# Patient Record
Sex: Male | Born: 1965 | Race: White | Hispanic: No | Marital: Married | Smoking: Current every day smoker
Health system: Southern US, Community
[De-identification: ages and names within clinical notes are randomized; demographics above are authoritative.]

## PROBLEM LIST (undated history)

## (undated) DIAGNOSIS — I10 Essential (primary) hypertension: Secondary | ICD-10-CM

## (undated) HISTORY — PX: OTHER SURGICAL HISTORY: SHX169

---

## 1999-07-01 ENCOUNTER — Emergency Department (HOSPITAL_COMMUNITY): Admission: EM | Admit: 1999-07-01 | Discharge: 1999-07-01 | Payer: Self-pay | Admitting: Emergency Medicine

## 2018-02-10 ENCOUNTER — Emergency Department (HOSPITAL_COMMUNITY): Payer: BLUE CROSS/BLUE SHIELD

## 2018-02-10 ENCOUNTER — Encounter (HOSPITAL_COMMUNITY): Payer: Self-pay | Admitting: Emergency Medicine

## 2018-02-10 ENCOUNTER — Inpatient Hospital Stay (HOSPITAL_COMMUNITY)
Admission: EM | Admit: 2018-02-10 | Discharge: 2018-02-13 | DRG: 446 | Disposition: A | Discharge status: Home / Self Care | Payer: BLUE CROSS/BLUE SHIELD | Attending: Internal Medicine | Admitting: Internal Medicine

## 2018-02-10 DIAGNOSIS — Z7982 Long term (current) use of aspirin: Secondary | ICD-10-CM

## 2018-02-10 DIAGNOSIS — N611 Abscess of the breast and nipple: Secondary | ICD-10-CM | POA: Diagnosis present

## 2018-02-10 DIAGNOSIS — K810 Acute cholecystitis: Secondary | ICD-10-CM | POA: Diagnosis present

## 2018-02-10 DIAGNOSIS — Z8249 Family history of ischemic heart disease and other diseases of the circulatory system: Secondary | ICD-10-CM

## 2018-02-10 DIAGNOSIS — I10 Essential (primary) hypertension: Secondary | ICD-10-CM | POA: Diagnosis present

## 2018-02-10 DIAGNOSIS — F17210 Nicotine dependence, cigarettes, uncomplicated: Secondary | ICD-10-CM | POA: Diagnosis present

## 2018-02-10 DIAGNOSIS — K811 Chronic cholecystitis: Secondary | ICD-10-CM | POA: Diagnosis present

## 2018-02-10 DIAGNOSIS — T3695XA Adverse effect of unspecified systemic antibiotic, initial encounter: Secondary | ICD-10-CM | POA: Diagnosis present

## 2018-02-10 DIAGNOSIS — H5713 Ocular pain, bilateral: Secondary | ICD-10-CM | POA: Diagnosis not present

## 2018-02-10 DIAGNOSIS — R509 Fever, unspecified: Secondary | ICD-10-CM | POA: Diagnosis not present

## 2018-02-10 DIAGNOSIS — R748 Abnormal levels of other serum enzymes: Secondary | ICD-10-CM | POA: Diagnosis present

## 2018-02-10 DIAGNOSIS — R1010 Upper abdominal pain, unspecified: Secondary | ICD-10-CM | POA: Diagnosis not present

## 2018-02-10 DIAGNOSIS — R300 Dysuria: Secondary | ICD-10-CM | POA: Diagnosis not present

## 2018-02-10 DIAGNOSIS — H53149 Visual discomfort, unspecified: Secondary | ICD-10-CM | POA: Diagnosis not present

## 2018-02-10 DIAGNOSIS — K760 Fatty (change of) liver, not elsewhere classified: Secondary | ICD-10-CM | POA: Diagnosis present

## 2018-02-10 DIAGNOSIS — Y92009 Unspecified place in unspecified non-institutional (private) residence as the place of occurrence of the external cause: Secondary | ICD-10-CM | POA: Diagnosis not present

## 2018-02-10 DIAGNOSIS — T368X5A Adverse effect of other systemic antibiotics, initial encounter: Secondary | ICD-10-CM | POA: Diagnosis not present

## 2018-02-10 DIAGNOSIS — R5381 Other malaise: Secondary | ICD-10-CM | POA: Diagnosis not present

## 2018-02-10 DIAGNOSIS — R7989 Other specified abnormal findings of blood chemistry: Secondary | ICD-10-CM | POA: Diagnosis present

## 2018-02-10 DIAGNOSIS — Z888 Allergy status to other drugs, medicaments and biological substances status: Secondary | ICD-10-CM

## 2018-02-10 DIAGNOSIS — N6002 Solitary cyst of left breast: Secondary | ICD-10-CM | POA: Diagnosis present

## 2018-02-10 DIAGNOSIS — R5383 Other fatigue: Secondary | ICD-10-CM | POA: Diagnosis not present

## 2018-02-10 DIAGNOSIS — L729 Follicular cyst of the skin and subcutaneous tissue, unspecified: Secondary | ICD-10-CM

## 2018-02-10 DIAGNOSIS — R51 Headache: Secondary | ICD-10-CM | POA: Diagnosis not present

## 2018-02-10 DIAGNOSIS — K7589 Other specified inflammatory liver diseases: Secondary | ICD-10-CM | POA: Diagnosis not present

## 2018-02-10 DIAGNOSIS — R531 Weakness: Secondary | ICD-10-CM | POA: Diagnosis not present

## 2018-02-10 DIAGNOSIS — K719 Toxic liver disease, unspecified: Secondary | ICD-10-CM

## 2018-02-10 DIAGNOSIS — K710 Toxic liver disease with cholestasis: Secondary | ICD-10-CM | POA: Diagnosis not present

## 2018-02-10 DIAGNOSIS — L089 Local infection of the skin and subcutaneous tissue, unspecified: Secondary | ICD-10-CM

## 2018-02-10 HISTORY — DX: Essential (primary) hypertension: I10

## 2018-02-10 LAB — URINALYSIS, ROUTINE W REFLEX MICROSCOPIC
Bacteria, UA: NONE SEEN
Glucose, UA: NEGATIVE mg/dL
HGB URINE DIPSTICK: NEGATIVE
Ketones, ur: NEGATIVE mg/dL
Leukocytes, UA: NEGATIVE
Nitrite: NEGATIVE
PH: 6 (ref 5.0–8.0)
Protein, ur: 30 mg/dL — AB
SPECIFIC GRAVITY, URINE: 1.021 (ref 1.005–1.030)

## 2018-02-10 LAB — CBC
HCT: 40.8 % (ref 39.0–52.0)
Hemoglobin: 14.7 g/dL (ref 13.0–17.0)
MCH: 33.3 pg (ref 26.0–34.0)
MCHC: 36 g/dL (ref 30.0–36.0)
MCV: 92.3 fL (ref 78.0–100.0)
PLATELETS: 276 10*3/uL (ref 150–400)
RBC: 4.42 MIL/uL (ref 4.22–5.81)
RDW: 12.1 % (ref 11.5–15.5)
WBC: 11.4 10*3/uL — AB (ref 4.0–10.5)

## 2018-02-10 LAB — COMPREHENSIVE METABOLIC PANEL
ALT: 231 U/L — AB (ref 17–63)
AST: 100 U/L — AB (ref 15–41)
Albumin: 3.5 g/dL (ref 3.5–5.0)
Alkaline Phosphatase: 258 U/L — ABNORMAL HIGH (ref 38–126)
Anion gap: 9 (ref 5–15)
BUN: 26 mg/dL — AB (ref 6–20)
CHLORIDE: 95 mmol/L — AB (ref 101–111)
CO2: 26 mmol/L (ref 22–32)
CREATININE: 1.28 mg/dL — AB (ref 0.61–1.24)
Calcium: 8.6 mg/dL — ABNORMAL LOW (ref 8.9–10.3)
GFR calc Af Amer: >60 mL/min (ref >60)
Glucose, Bld: 115 mg/dL — ABNORMAL HIGH (ref 65–99)
POTASSIUM: 4 mmol/L (ref 3.5–5.1)
SODIUM: 130 mmol/L — AB (ref 135–145)
Total Bilirubin: 6.1 mg/dL — ABNORMAL HIGH (ref 0.3–1.2)
Total Protein: 7.2 g/dL (ref 6.5–8.1)

## 2018-02-10 LAB — LIPASE, BLOOD: LIPASE: 26 U/L (ref 11–51)

## 2018-02-10 LAB — I-STAT CG4 LACTIC ACID, ED
Lactic Acid, Venous: 1.11 mmol/L (ref 0.5–1.9)
Lactic Acid, Venous: 0.97 mmol/L (ref 0.5–1.9)

## 2018-02-10 MED ORDER — SODIUM CHLORIDE 0.9 % IV BOLUS (SEPSIS)
1000.0000 mL | Freq: Once | INTRAVENOUS | Status: AC
  Administered 2018-02-10: 1000 mL via INTRAVENOUS

## 2018-02-10 MED ORDER — ACETAMINOPHEN 325 MG PO TABS: 650.0000 mg | ORAL_TABLET | Freq: Four times a day (QID) | ORAL | Status: DC | PRN

## 2018-02-10 MED ORDER — IOPAMIDOL (ISOVUE-300) INJECTION 61%
INTRAVENOUS | Status: AC
  Administered 2018-02-10: 100 mL
  Filled 2018-02-10: qty 100

## 2018-02-10 MED ORDER — SODIUM CHLORIDE 0.9 % IV SOLN
INTRAVENOUS | Status: DC
  Administered 2018-02-10: 100 mL/h via INTRAVENOUS
  Administered 2018-02-11 – 2018-02-12 (×2): via INTRAVENOUS
  Administered 2018-02-12: 75 mL/h via INTRAVENOUS

## 2018-02-10 MED ORDER — MORPHINE SULFATE (PF) 4 MG/ML IV SOLN
3.5000 mg | Freq: Once | INTRAVENOUS | Status: AC
  Administered 2018-02-10: 3.5 mg via INTRAVENOUS
  Filled 2018-02-10: qty 1

## 2018-02-10 MED ORDER — ENOXAPARIN SODIUM 40 MG/0.4ML ~~LOC~~ SOLN
40.0000 mg | SUBCUTANEOUS | Status: DC
  Administered 2018-02-10 – 2018-02-12 (×3): 40 mg via SUBCUTANEOUS
  Filled 2018-02-10 (×3): qty 0.4

## 2018-02-10 MED ORDER — ACETAMINOPHEN 650 MG RE SUPP: 650.0000 mg | Freq: Four times a day (QID) | RECTAL | Status: DC | PRN

## 2018-02-10 MED ORDER — NICOTINE 21 MG/24HR TD PT24
21.0000 mg | MEDICATED_PATCH | Freq: Every day | TRANSDERMAL | Status: DC
  Administered 2018-02-11 – 2018-02-12 (×2): 21 mg via TRANSDERMAL
  Filled 2018-02-10 (×2): qty 1

## 2018-02-10 MED ORDER — ONDANSETRON HCL 4 MG PO TABS: 4.0000 mg | ORAL_TABLET | Freq: Four times a day (QID) | ORAL | Status: DC | PRN

## 2018-02-10 MED ORDER — PIPERACILLIN-TAZOBACTAM 3.375 G IVPB 30 MIN
3.3750 g | Freq: Once | INTRAVENOUS | Status: AC
  Administered 2018-02-10: 3.375 g via INTRAVENOUS
  Filled 2018-02-10: qty 50

## 2018-02-10 MED ORDER — TECHNETIUM TC 99M MEBROFENIN IV KIT
7.8000 | PACK | Freq: Once | INTRAVENOUS | Status: AC | PRN
  Administered 2018-02-10: 7.8 via INTRAVENOUS

## 2018-02-10 MED ORDER — LIDOCAINE-EPINEPHRINE (PF) 2 %-1:200000 IJ SOLN
20.0000 mL | Freq: Once | INTRAMUSCULAR | Status: AC
  Administered 2018-02-10: 20 mL
  Filled 2018-02-10: qty 20

## 2018-02-10 MED ORDER — ONDANSETRON HCL 4 MG/2ML IJ SOLN: 4.0000 mg | Freq: Four times a day (QID) | INTRAMUSCULAR | Status: DC | PRN

## 2018-02-10 MED ORDER — SODIUM CHLORIDE 0.9 % IJ SOLN
INTRAMUSCULAR | Status: AC
  Filled 2018-02-10: qty 50

## 2018-02-10 MED ORDER — PIPERACILLIN-TAZOBACTAM 3.375 G IVPB
3.3750 g | Freq: Three times a day (TID) | INTRAVENOUS | Status: DC
  Administered 2018-02-11 (×2): 3.375 g via INTRAVENOUS
  Filled 2018-02-10 (×2): qty 50

## 2018-02-10 NOTE — ED Notes (Signed)
Patient remains in hida scan.

## 2018-02-10 NOTE — Consult Note (Signed)
[facility] Surgery Consult/Admission Note  Elim Economou Jul 04, 1966  [id number].    Requesting MD: Dr. Venora Maples Chief Complaint/Reason for Consult: RUQ abdominal pain  HPI:   Pt is a 52 year old male with a history of HTN who presented to the ED under advice of his PCP with elevated LFT's and tbili. Pt states he has been fatigued for the last 2 weeks with fevers, chills, anorexia, mild dull RUQ abdominal pain, body aches, and yellowing of eyes. Abdominal pain is nonradiating,nothing makes it better or worse. PCP thought symptoms were related to a left breast abscess that was improving but pt's symptoms were not improving. Over the course of time he was treated with an IM dose of PCN, bactrim, and a Z-pak without any significant improvement. EDP I&D'd left breast abscess. Pt is a smoker and drinks 6 beers a day. He denies CP, SOB, blood in stools, vomiting, nausea. Wife at bedside. We were asked to see about possible cholecystitis.   Workup: Labs: Alk phos 258, AST 100, ALT 231, Tbili 6.1, WBC 11.4,Creatinine 1.28 [place]: Gallbladder wall thickening and mild pericholecystic edema that is likely reactive. Hepatic steatosis. CT abd/pel: There is subtle irregularity of the liver contour without other CT evidence of cirrhosis. Gallbladder is decompressed without cholelithiasis. No evidence of biliary dilatation  ROS:  Review of Systems  Constitutional: Positive for chills, fever and malaise/fatigue. Negative for diaphoresis.  HENT: Negative for sore throat.   Eyes:       + for yellowing of eyes  Respiratory: Negative for cough and shortness of breath.   Cardiovascular: Negative for chest pain.  Gastrointestinal: Positive for abdominal pain. Negative for blood in stool, constipation, diarrhea, melena, nausea and vomiting.  Genitourinary: Positive for dysuria (with fevers only).  Musculoskeletal: Positive for myalgias.  Skin: Negative for rash.       + for yellowing of skin    Neurological: Negative for dizziness, focal weakness, loss of consciousness and headaches.  All other systems reviewed and are negative.    No family history on file.  Past Medical History:  Diagnosis Date  . Hypertension     History reviewed. No pertinent surgical history.  Social History:  reports that he has been smoking cigarettes.  he has never used smokeless tobacco. He reports that he drinks alcohol. His drug history is not on file.  Allergies:  Allergies  Allergen Reactions  . Statins Anxiety     (Not in a hospital admission)  Blood pressure 96/64, pulse 94, temperature 99.5 F (37.5 C), temperature source Oral, resp. rate 20, height 5' 9"  (1.753 m), weight 190 lb (86.2 kg), SpO2 99 %.  Physical Exam  Constitutional: He is oriented to person, place, and time and well-developed, well-nourished, and in no distress. No distress.  HENT:  Head: Normocephalic and atraumatic.  Nose: Nose normal.  Mouth/Throat: Oropharynx is clear and moist. No oropharyngeal exudate.  Eyes: Conjunctivae are normal. Pupils are equal, round, and reactive to light. Right eye exhibits no discharge. Left eye exhibits no discharge. Scleral icterus is present.  Neck: Normal range of motion. Neck supple. No thyromegaly present.  Cardiovascular: Normal rate, regular rhythm, normal heart sounds and intact distal pulses.  No murmur heard. Pulses:      Dorsalis pedis pulses are 2+ on the right side, and 2+ on the left side.  Pulmonary/Chest: Effort normal and breath sounds normal. No respiratory distress. He has no wheezes. He has no rhonchi. He has no rales.  Abdominal: Soft. Normal  appearance and bowel sounds are normal. He exhibits no distension. There is hepatomegaly. There is no splenomegaly. There is tenderness in the right upper quadrant. There is no rigidity and no guarding.  Musculoskeletal: Normal range of motion. He exhibits no edema, tenderness or deformity.  Lymphadenopathy:    He has  no cervical adenopathy.  Neurological: He is alert and oriented to person, place, and time.  Skin: Skin is warm and dry. No rash noted. He is not diaphoretic.  Mild jaundice  Psychiatric: Mood and affect normal.  Nursing note and vitals reviewed.   Results for orders placed or performed during the hospital encounter of 02/10/18 (from the past 48 hour(s))  CBC     Status: Abnormal   Collection Time: 02/10/18 12:04 PM  Result Value Ref Range   WBC 11.4 (H) 4.0 - 10.5 K/uL   RBC 4.42 4.22 - 5.81 MIL/uL   Hemoglobin 14.7 13.0 - 17.0 g/dL   HCT 40.8 39.0 - 52.0 %   MCV 92.3 78.0 - 100.0 fL   MCH 33.3 26.0 - 34.0 pg   MCHC 36.0 30.0 - 36.0 g/dL   RDW 12.1 11.5 - 15.5 %   Platelets 276 150 - 400 K/uL    Comment: Performed at [facility], [place] [address], [place], [place] [address]  Comprehensive metabolic panel     Status: Abnormal   Collection Time: 02/10/18 12:04 PM  Result Value Ref Range   Sodium 130 (L) 135 - 145 mmol/L   Potassium 4.0 3.5 - 5.1 mmol/L   Chloride 95 (L) 101 - 111 mmol/L   CO2 26 22 - 32 mmol/L   Glucose, Bld 115 (H) 65 - 99 mg/dL   BUN 26 (H) 6 - 20 mg/dL   Creatinine, Ser 1.28 (H) 0.61 - 1.24 mg/dL   Calcium 8.6 (L) 8.9 - 10.3 mg/dL   Total Protein 7.2 6.5 - 8.1 g/dL   Albumin 3.5 3.5 - 5.0 g/dL   AST 100 (H) 15 - 41 U/L   ALT 231 (H) 17 - 63 U/L   Alkaline Phosphatase 258 (H) 38 - 126 U/L   Total Bilirubin 6.1 (H) 0.3 - 1.2 mg/dL   GFR calc non Af Amer >60 >60 mL/min   GFR calc Af Amer >60 >60 mL/min    Comment: (NOTE) The eGFR has been calculated using the CKD EPI equation. This calculation has not been validated in all clinical situations. eGFR's persistently <60 mL/min signify possible Chronic Kidney Disease.    Anion gap 9 5 - 15    Comment: Performed at [facility], [place] [address], [place], [place] [address]  Lipase, blood     Status: None   Collection Time: 02/10/18 12:04 PM  Result Value Ref Range    Lipase 26 11 - 51 U/L    Comment: Performed at [facility], [place] [address], [place], [place] [address]  I-Stat CG4 Lactic Acid, ED     Status: None   Collection Time: 02/10/18 12:12 PM  Result Value Ref Range   Lactic Acid, Venous 1.11 0.5 - 1.9 mmol/L  Urinalysis, Routine w reflex microscopic     Status: Abnormal   Collection Time: 02/10/18  1:08 PM  Result Value Ref Range   Color, Urine AMBER (A) YELLOW    Comment: BIOCHEMICALS MAY BE AFFECTED BY COLOR   APPearance CLEAR CLEAR   Specific Gravity, Urine 1.021 1.005 - 1.030   pH 6.0 5.0 - 8.0  Glucose, UA NEGATIVE NEGATIVE mg/dL   Hgb urine dipstick NEGATIVE NEGATIVE   Bilirubin Urine MODERATE (A) NEGATIVE   Ketones, ur NEGATIVE NEGATIVE mg/dL   Protein, ur 30 (A) NEGATIVE mg/dL   Nitrite NEGATIVE NEGATIVE   Leukocytes, UA NEGATIVE NEGATIVE   RBC / HPF 0-5 0 - 5 RBC/hpf   WBC, UA 6-30 0 - 5 WBC/hpf   Bacteria, UA NONE SEEN NONE SEEN   Squamous Epithelial / LPF 0-5 (A) NONE SEEN   Mucus PRESENT    Hyaline Casts, UA PRESENT     Comment: Performed at [facility], [place] [address] [place], [place] [address]   Dg Chest 2 View  Result Date: 02/10/2018 CLINICAL DATA:  Fever and malaise EXAM: CHEST - 2 VIEW COMPARISON:  None. FINDINGS: The heart size and mediastinal contours are within normal limits. Both lungs are clear. Mild scarring in the lingula. The visualized skeletal structures are unremarkable. IMPRESSION: No active cardiopulmonary disease. Electronically Signed   By: Franchot Gallo M.D.   On: 02/10/2018 12:48   Ct Abdomen Pelvis W Contrast  Result Date: 02/10/2018 CLINICAL DATA:  52 year old male with a history of elevated LFTs and scleral icterus EXAM: CT ABDOMEN AND PELVIS WITH CONTRAST TECHNIQUE: Multidetector CT imaging of the abdomen and pelvis was performed using the standard protocol following bolus administration of intravenous contrast. CONTRAST:  136m ISOVUE-300 IOPAMIDOL  (ISOVUE-300) INJECTION 61% COMPARISON:  Ultrasound 02/10/2018 FINDINGS: Lower chest: Respiratory motion somewhat limits evaluation of the lungs. Minimal atelectasis/scarring at the dependent aspect of the lung bases. Hepatobiliary: Cranial caudal span of the right liver measures 19.3 cm. Subtle irregularity on the surface of the liver. No focal liver lesion. No periportal edema. Gallbladder relatively decompressed, otherwise unremarkable. Pancreas: Unremarkable pancreas Spleen: Unremarkable spleen Adrenals/Urinary Tract: Unremarkable adrenal glands. Unremarkable right kidney with no hydronephrosis or nephrolithiasis. Unremarkable course of the right ureter. Unremarkable left kidney with no hydronephrosis or nephrolithiasis. Unremarkable course of the left ureter. Unremarkable urinary bladder Stomach/Bowel: Unremarkable stomach. Unremarkable small bowel without distention. No focal wall thickening. No inflammatory changes of the mesentery. Normal appendix. No abnormally distended colon. No inflammatory changes. No focal colonic wall thickening Vascular/Lymphatic: Mild atherosclerotic changes of the abdominal aorta. No dissection or aneurysm. No periaortic fluid. The iliac and bilateral proximal femoral arteries are patent. Small lymph nodes in the portacaval nodal station, not significantly enlarged. Small lymph nodes in the gastrohepatic ligament, not enlarged. No significant mesenteric adenopathy. No retroperitoneal adenopathy. No inguinal adenopathy. Reproductive: Internal prostate calcifications with transverse diameter of the prostate measuring 4.2 cm Other: No abdominal wall hernia Musculoskeletal: No acute displaced fracture. Mild degenerative changes of the spine. Mild degenerative changes of the hips. IMPRESSION: No acute CT finding. There is subtle irregularity of the liver contour without other CT evidence of cirrhosis. Gallbladder is decompressed without cholelithiasis. No evidence of biliary dilatation.  Aortic Atherosclerosis (ICD10-I70.0). Electronically Signed   By: JCorrie MckusickD.O.   On: 02/10/2018 14:53   [place]domen Limited Ruq  Result Date: 02/10/2018 CLINICAL DATA:  Upper abdominal pain.  Elevated liver enzymes. EXAM: ULTRASOUND ABDOMEN LIMITED RIGHT UPPER QUADRANT COMPARISON:  None. FINDINGS: Gallbladder: Gallbladder wall thickening to 4 mm with small crescent of edema around the fundus. Given negative Murphy sign, no stones, and limited distension this is likely reactive thickening rather than from cholecystitis. Common bile duct: Diameter: 5 mm Liver: Echogenic diffusely with gallbladder fossa sparing. No focal lesion is seen. Portal vein is patent on color Doppler imaging with normal  direction of blood flow towards the liver. IMPRESSION: 1. Gallbladder wall thickening and mild pericholecystic edema that is likely reactive, as above. 2. Hepatic steatosis. Electronically Signed   By: Monte Fantasia M.D.   On: 02/10/2018 12:40      Assessment/Plan  Hx of HTN  RUQ abdominal pain elevated LFT's and Tbili Fevers Jaundice  - no cholelithiasis, no evidence of biliary dilatation, gallbladder wall thickening up to 72m likely reactive rather than from cholecystitis - hepatic steatosis on [place] concerns for cirrhosis on CT in setting of ETOH abuse - HIDA scan pending to rule out cholecystitis - Do not believe this is cholecystitis and more concerning for cholangitis although no biliary dilatation was seen on imaging.   If HIDA is negative would recommend MRCP for further evaluation of bilary ducts.   We will follow. Thank you for the consult.   JKalman Drape PMt [facility] 02/10/2018, 4:01 PM Pager: [phone]: [phone]-Fri 7:00 am-4:30 pm Sat-Sun 7:00 am-11:30 am

## 2018-02-10 NOTE — Consult Note (Signed)
Reason for Consult: Abnormal liver enzymes, fevers, chills, and RUQ pain Referring Physician: ER  Manuel Bates HPI: This is a 52 year old male with a PMH of HTN who was complaining about fevers, chills, and RUQ pain for a couple of weeks. Initially his symptoms started with a left subcutaneous breast cyst.  He was told that if it enlarged further evaluation/treatment would be pursued.  He reported that the cyst was enlarging and he had fever up to 102.  It was around that time that he also complained about RUQ pain.  This past Friday, one week ago, his wife reported that his symptoms were quite severe and was at home with fevers and shaking chills.  Over the course of time he was treated with an IM dose of PCN, bactrim, and a Z-pak without any significant improvement.  There was evidence of bilirubin in his urine and then a liver panel was performed yesterday revealing an obstructive pattern.  As a result he was advised to present to the ER.  The ER ultrasound was negative for stones or biliary ductal dilation, however, his gallbladder was thickened at 4 mm.  During the ultrasound examination he did not exhibit a Murphy's sign, but he does complain about RUQ pain.  Per his report, he has a high tolerance to pain.  Past Medical History:  Diagnosis Date  . Hypertension     History reviewed. No pertinent surgical history.  No family history on file.  Social History:  reports that he has been smoking cigarettes.  he has never used smokeless tobacco. He reports that he drinks alcohol. His drug history is not on file.  Allergies:  Allergies  Allergen Reactions  . Statins Anxiety    Medications:  Scheduled: . sodium chloride       Continuous:   Results for orders placed or performed during the hospital encounter of 02/10/18 (from the past 24 hour(s))  CBC     Status: Abnormal   Collection Time: 02/10/18 12:04 PM  Result Value Ref Range   WBC 11.4 (H) 4.0 - 10.5 K/uL   RBC 4.42  4.22 - 5.81 MIL/uL   Hemoglobin 14.7 13.0 - 17.0 g/dL   HCT 16.1 09.6 - 04.5 %   MCV 92.3 78.0 - 100.0 fL   MCH 33.3 26.0 - 34.0 pg   MCHC 36.0 30.0 - 36.0 g/dL   RDW 40.9 81.1 - 91.4 %   Platelets 276 150 - 400 K/uL  Comprehensive metabolic panel     Status: Abnormal   Collection Time: 02/10/18 12:04 PM  Result Value Ref Range   Sodium 130 (L) 135 - 145 mmol/L   Potassium 4.0 3.5 - 5.1 mmol/L   Chloride 95 (L) 101 - 111 mmol/L   CO2 26 22 - 32 mmol/L   Glucose, Bld 115 (H) 65 - 99 mg/dL   BUN 26 (H) 6 - 20 mg/dL   Creatinine, Ser 7.82 (H) 0.61 - 1.24 mg/dL   Calcium 8.6 (L) 8.9 - 10.3 mg/dL   Total Protein 7.2 6.5 - 8.1 g/dL   Albumin 3.5 3.5 - 5.0 g/dL   AST 956 (H) 15 - 41 U/L   ALT 231 (H) 17 - 63 U/L   Alkaline Phosphatase 258 (H) 38 - 126 U/L   Total Bilirubin 6.1 (H) 0.3 - 1.2 mg/dL   GFR calc non Af Amer >60 >60 mL/min   GFR calc Af Amer >60 >60 mL/min   Anion gap 9 5 - 15  Lipase, blood     Status: None   Collection Time: 02/10/18 12:04 PM  Result Value Ref Range   Lipase 26 11 - 51 U/L  I-Stat CG4 Lactic Acid, ED     Status: None   Collection Time: 02/10/18 12:12 PM  Result Value Ref Range   Lactic Acid, Venous 1.11 0.5 - 1.9 mmol/L  Urinalysis, Routine w reflex microscopic     Status: Abnormal   Collection Time: 02/10/18  1:08 PM  Result Value Ref Range   Color, Urine AMBER (A) YELLOW   APPearance CLEAR CLEAR   Specific Gravity, Urine 1.021 1.005 - 1.030   pH 6.0 5.0 - 8.0   Glucose, UA NEGATIVE NEGATIVE mg/dL   Hgb urine dipstick NEGATIVE NEGATIVE   Bilirubin Urine MODERATE (A) NEGATIVE   Ketones, ur NEGATIVE NEGATIVE mg/dL   Protein, ur 30 (A) NEGATIVE mg/dL   Nitrite NEGATIVE NEGATIVE   Leukocytes, UA NEGATIVE NEGATIVE   RBC / HPF 0-5 0 - 5 RBC/hpf   WBC, UA 6-30 0 - 5 WBC/hpf   Bacteria, UA NONE SEEN NONE SEEN   Squamous Epithelial / LPF 0-5 (A) NONE SEEN   Mucus PRESENT    Hyaline Casts, UA PRESENT      Dg Chest 2 View  Result Date:  02/10/2018 CLINICAL DATA:  Fever and malaise EXAM: CHEST - 2 VIEW COMPARISON:  None. FINDINGS: The heart size and mediastinal contours are within normal limits. Both lungs are clear. Mild scarring in the lingula. The visualized skeletal structures are unremarkable. IMPRESSION: No active cardiopulmonary disease. Electronically Signed   By: Marlan Palauharles  Clark M.D.   On: 02/10/2018 12:48   Ct Abdomen Pelvis W Contrast  Result Date: 02/10/2018 CLINICAL DATA:  52 year old male with a history of elevated LFTs and scleral icterus EXAM: CT ABDOMEN AND PELVIS WITH CONTRAST TECHNIQUE: Multidetector CT imaging of the abdomen and pelvis was performed using the standard protocol following bolus administration of intravenous contrast. CONTRAST:  100mL ISOVUE-300 IOPAMIDOL (ISOVUE-300) INJECTION 61% COMPARISON:  Ultrasound 02/10/2018 FINDINGS: Lower chest: Respiratory motion somewhat limits evaluation of the lungs. Minimal atelectasis/scarring at the dependent aspect of the lung bases. Hepatobiliary: Cranial caudal span of the right liver measures 19.3 cm. Subtle irregularity on the surface of the liver. No focal liver lesion. No periportal edema. Gallbladder relatively decompressed, otherwise unremarkable. Pancreas: Unremarkable pancreas Spleen: Unremarkable spleen Adrenals/Urinary Tract: Unremarkable adrenal glands. Unremarkable right kidney with no hydronephrosis or nephrolithiasis. Unremarkable course of the right ureter. Unremarkable left kidney with no hydronephrosis or nephrolithiasis. Unremarkable course of the left ureter. Unremarkable urinary bladder Stomach/Bowel: Unremarkable stomach. Unremarkable small bowel without distention. No focal wall thickening. No inflammatory changes of the mesentery. Normal appendix. No abnormally distended colon. No inflammatory changes. No focal colonic wall thickening Vascular/Lymphatic: Mild atherosclerotic changes of the abdominal aorta. No dissection or aneurysm. No periaortic fluid.  The iliac and bilateral proximal femoral arteries are patent. Small lymph nodes in the portacaval nodal station, not significantly enlarged. Small lymph nodes in the gastrohepatic ligament, not enlarged. No significant mesenteric adenopathy. No retroperitoneal adenopathy. No inguinal adenopathy. Reproductive: Internal prostate calcifications with transverse diameter of the prostate measuring 4.2 cm Other: No abdominal wall hernia Musculoskeletal: No acute displaced fracture. Mild degenerative changes of the spine. Mild degenerative changes of the hips. IMPRESSION: No acute CT finding. There is subtle irregularity of the liver contour without other CT evidence of cirrhosis. Gallbladder is decompressed without cholelithiasis. No evidence of biliary dilatation. Aortic Atherosclerosis (ICD10-I70.0). Electronically Signed  By: Gilmer Mor D.O.   On: 02/10/2018 14:53   US Abdomen Limited Ruq  Result Date: 02/10/2018 CLINICAL DATA:  Upper abdominal pain.  Elevated liver enzymes. EXAM: ULTRASOUND ABDOMEN LIMITED RIGHT UPPER QUADRANT COMPARISON:  None. FINDINGS: Gallbladder: Gallbladder wall thickening to 4 mm with small crescent of edema around the fundus. Given negative Murphy sign, no stones, and limited distension this is likely reactive thickening rather than from cholecystitis. Common bile duct: Diameter: 5 mm Liver: Echogenic diffusely with gallbladder fossa sparing. No focal lesion is seen. Portal vein is patent on color Doppler imaging with normal direction of blood flow towards the liver. IMPRESSION: 1. Gallbladder wall thickening and mild pericholecystic edema that is likely reactive, as above. 2. Hepatic steatosis. Electronically Signed   By: Marnee Spring M.D.   On: 02/10/2018 12:40    ROS:  As stated above in the HPI otherwise negative.  Blood pressure 93/65, pulse 91, temperature 99.5 F (37.5 C), temperature source Oral, resp. rate 20, height 5\' 9"  (1.753 m), weight 86.2 kg (190 lb), SpO2 95 %.     PE: Gen: NAD, Alert and Oriented HEENT:  [place]/AT, EOMI Neck: Supple, no LAD Lungs: CTA Bilaterally CV: RRR without M/G/R ABM: Soft, some RUQ tenderness, +BS Ext: No C/C/E  Assessment/Plan: 1) Abnormal liver enzymes. 2) Jaundice. 3) RUQ pain.   Even though the ultrasound does not show acute cholecystitis, I am concerned that he has a gallbladder etiology for his symptoms.  A CT scan was performed and pending.  The physical examination was significant for some pain with palpation in the RUQ, but it was not severe.  There is no evidence of overt stones or biliary ductal dilation.  Surgical input would be beneficial in his case.  Plan: 1) Surgical consultation. 2) Supportive care for now.  Manuel Bates D 02/10/2018, 3:38 PM

## 2018-02-10 NOTE — ED Triage Notes (Signed)
[facility] to [facility] ct [facility]. [place] discussed results with Dr [name] with GI.

## 2018-02-10 NOTE — ED Notes (Signed)
ED TO INPATIENT HANDOFF REPORT  Name/Age/Gender Manuel Bates 52 y.o. male  Code Status Code Status History    This patient does not have a recorded code status. Please follow your organizational policy for patients in this situation.    Advance Directive Documentation     Most Recent Value  Type of Advance Directive  Healthcare Power of Attorney, Living will  Pre-existing out of facility DNR order (yellow form or pink MOST form)  No data  "MOST" Form in Place?  No data      Home/SNF/Other Home  Chief Complaint ct scan   Level of Care/Admitting Diagnosis ED Disposition    ED Disposition Condition Comment   Admit  Hospital Area: [facility] [[id number]]  Level of Care: Med-Surg [16]  Diagnosis: Acute cholecystitis [575.0.ICD-9-CM]  Admitting Physician: Hosie Poisson [[id number]]  Attending Physician: Hosie Poisson [phone]  Estimated length of stay: past midnight tomorrow  Certification:: I certify this patient will need inpatient services for at least 2 midnights  PT Class (Do Not Modify): Inpatient [101]  PT Acc Code (Do Not Modify): Private [1]       Medical History Past Medical History:  Diagnosis Date  . Hypertension     Allergies Allergies  Allergen Reactions  . Statins Anxiety    IV Location/Drains/Wounds Patient Lines/Drains/Airways Status   Active Line/Drains/Airways    Name:   Placement date:   Placement time:   Site:   Days:   Peripheral IV 02/10/18 Left Antecubital   02/10/18    1201    Antecubital   less than 1          Labs/Imaging Results for orders placed or performed during the hospital encounter of 02/10/18 (from the past 48 hour(s))  CBC     Status: Abnormal   Collection Time: 02/10/18 12:04 PM  Result Value Ref Range   WBC 11.4 (H) 4.0 - 10.5 K/uL   RBC 4.42 4.22 - 5.81 MIL/uL   Hemoglobin 14.7 13.0 - 17.0 g/dL   HCT 40.8 39.0 - 52.0 %   MCV 92.3 78.0 - 100.0 fL   MCH 33.3 26.0 - 34.0 pg   MCHC 36.0 30.0 - 36.0  g/dL   RDW 12.1 11.5 - 15.5 %   Platelets 276 150 - 400 K/uL    Comment: Performed at [facility], [place] [address], [place], [place] [address]  Comprehensive metabolic panel     Status: Abnormal   Collection Time: 02/10/18 12:04 PM  Result Value Ref Range   Sodium 130 (L) 135 - 145 mmol/L   Potassium 4.0 3.5 - 5.1 mmol/L   Chloride 95 (L) 101 - 111 mmol/L   CO2 26 22 - 32 mmol/L   Glucose, Bld 115 (H) 65 - 99 mg/dL   BUN 26 (H) 6 - 20 mg/dL   Creatinine, Ser 1.28 (H) 0.61 - 1.24 mg/dL   Calcium 8.6 (L) 8.9 - 10.3 mg/dL   Total Protein 7.2 6.5 - 8.1 g/dL   Albumin 3.5 3.5 - 5.0 g/dL   AST 100 (H) 15 - 41 U/L   ALT 231 (H) 17 - 63 U/L   Alkaline Phosphatase 258 (H) 38 - 126 U/L   Total Bilirubin 6.1 (H) 0.3 - 1.2 mg/dL   GFR calc non Af Amer >60 >60 mL/min   GFR calc Af Amer >60 >60 mL/min    Comment: (NOTE) The eGFR has been calculated using the CKD EPI equation. This calculation has not been  validated in all clinical situations. eGFR's persistently <60 mL/min signify possible Chronic Kidney Disease.    Anion gap 9 5 - 15    Comment: Performed at [facility], [place] [address] [place], [place] [address]  Lipase, blood     Status: None   Collection Time: 02/10/18 12:04 PM  Result Value Ref Range   Lipase 26 11 - 51 U/L    Comment: Performed at [facility], [place] [address] [place], [place] [address]  I-Stat CG4 Lactic Acid, ED     Status: None   Collection Time: 02/10/18 12:12 PM  Result Value Ref Range   Lactic Acid, Venous 1.11 0.5 - 1.9 mmol/L  Urinalysis, Routine w reflex microscopic     Status: Abnormal   Collection Time: 02/10/18  1:08 PM  Result Value Ref Range   Color, Urine AMBER (A) YELLOW    Comment: BIOCHEMICALS MAY BE AFFECTED BY COLOR   APPearance CLEAR CLEAR   Specific Gravity, Urine 1.021 1.005 - 1.030   pH 6.0 5.0 - 8.0   Glucose, UA NEGATIVE NEGATIVE mg/dL   Hgb urine dipstick NEGATIVE NEGATIVE    Bilirubin Urine MODERATE (A) NEGATIVE   Ketones, ur NEGATIVE NEGATIVE mg/dL   Protein, ur 30 (A) NEGATIVE mg/dL   Nitrite NEGATIVE NEGATIVE   Leukocytes, UA NEGATIVE NEGATIVE   RBC / HPF 0-5 0 - 5 RBC/hpf   WBC, UA 6-30 0 - 5 WBC/hpf   Bacteria, UA NONE SEEN NONE SEEN   Squamous Epithelial / LPF 0-5 (A) NONE SEEN   Mucus PRESENT    Hyaline Casts, UA PRESENT     Comment: Performed at [facility], [place] [address], [place], [place] [address]  I-Stat CG4 Lactic Acid, ED     Status: None   Collection Time: 02/10/18  5:01 PM  Result Value Ref Range   Lactic Acid, Venous 0.97 0.5 - 1.9 mmol/L   Dg Chest 2 View  Result Date: 02/10/2018 CLINICAL DATA:  Fever and malaise EXAM: CHEST - 2 VIEW COMPARISON:  None. FINDINGS: The heart size and mediastinal contours are within normal limits. Both lungs are clear. Mild scarring in the lingula. The visualized skeletal structures are unremarkable. IMPRESSION: No active cardiopulmonary disease. Electronically Signed   By: Franchot Gallo M.D.   On: 02/10/2018 12:48   Ct Abdomen Pelvis W Contrast  Result Date: 02/10/2018 CLINICAL DATA:  52 year old male with a history of elevated LFTs and scleral icterus EXAM: CT ABDOMEN AND PELVIS WITH CONTRAST TECHNIQUE: Multidetector CT imaging of the abdomen and pelvis was performed using the standard protocol following bolus administration of intravenous contrast. CONTRAST:  181m ISOVUE-300 IOPAMIDOL (ISOVUE-300) INJECTION 61% COMPARISON:  Ultrasound 02/10/2018 FINDINGS: Lower chest: Respiratory motion somewhat limits evaluation of the lungs. Minimal atelectasis/scarring at the dependent aspect of the lung bases. Hepatobiliary: Cranial caudal span of the right liver measures 19.3 cm. Subtle irregularity on the surface of the liver. No focal liver lesion. No periportal edema. Gallbladder relatively decompressed, otherwise unremarkable. Pancreas: Unremarkable pancreas Spleen: Unremarkable spleen  Adrenals/Urinary Tract: Unremarkable adrenal glands. Unremarkable right kidney with no hydronephrosis or nephrolithiasis. Unremarkable course of the right ureter. Unremarkable left kidney with no hydronephrosis or nephrolithiasis. Unremarkable course of the left ureter. Unremarkable urinary bladder Stomach/Bowel: Unremarkable stomach. Unremarkable small bowel without distention. No focal wall thickening. No inflammatory changes of the mesentery. Normal appendix. No abnormally distended colon. No inflammatory changes. No focal colonic wall thickening Vascular/Lymphatic: Mild atherosclerotic changes of the abdominal  aorta. No dissection or aneurysm. No periaortic fluid. The iliac and bilateral proximal femoral arteries are patent. Small lymph nodes in the portacaval nodal station, not significantly enlarged. Small lymph nodes in the gastrohepatic ligament, not enlarged. No significant mesenteric adenopathy. No retroperitoneal adenopathy. No inguinal adenopathy. Reproductive: Internal prostate calcifications with transverse diameter of the prostate measuring 4.2 cm Other: No abdominal wall hernia Musculoskeletal: No acute displaced fracture. Mild degenerative changes of the spine. Mild degenerative changes of the hips. IMPRESSION: No acute CT finding. There is subtle irregularity of the liver contour without other CT evidence of cirrhosis. Gallbladder is decompressed without cholelithiasis. No evidence of biliary dilatation. Aortic Atherosclerosis (ICD10-I70.0). Electronically Signed   By: Corrie Mckusick D.O.   On: 02/10/2018 14:53   US Abdomen Limited Ruq  Result Date: 02/10/2018 CLINICAL DATA:  Upper abdominal pain.  Elevated liver enzymes. EXAM: ULTRASOUND ABDOMEN LIMITED RIGHT UPPER QUADRANT COMPARISON:  None. FINDINGS: Gallbladder: Gallbladder wall thickening to 4 mm with small crescent of edema around the fundus. Given negative Murphy sign, no stones, and limited distension this is likely reactive thickening  rather than from cholecystitis. Common bile duct: Diameter: 5 mm Liver: Echogenic diffusely with gallbladder fossa sparing. No focal lesion is seen. Portal vein is patent on color Doppler imaging with normal direction of blood flow towards the liver. IMPRESSION: 1. Gallbladder wall thickening and mild pericholecystic edema that is likely reactive, as above. 2. Hepatic steatosis. Electronically Signed   By: Monte Fantasia M.D.   On: 02/10/2018 12:40    Pending Labs Unresulted Labs (From admission, onward)   Start     Ordered   02/10/18 1633  Urine culture  Add-on,   STAT     02/10/18 1632   02/10/18 1632  Blood culture (routine x 2)  BLOOD CULTURE X 2,   STAT     02/10/18 1631   02/10/18 1151  Wound or Superficial Culture  Once,   STAT     02/10/18 1151   Signed and Held  HIV antibody (Routine Testing)  Once,   R     Signed and Held   Signed and Held  Creatinine, serum  (enoxaparin (LOVENOX)    CrCl >/= 30 ml/min)  Weekly,   R    Comments:  while on enoxaparin therapy    Signed and Held   Signed and Held  Comprehensive metabolic panel  Tomorrow morning,   R     Signed and Held      Vitals/Pain Today's Vitals   02/10/18 1400 02/10/18 1505 02/10/18 1530 02/10/18 1642  BP: _0 100/67  Pulse: 90 91 94 96  Resp: _1 Temp:    (!) 102.1 F (38.9 C)  TempSrc:    Oral  SpO2: 100% 95% 99% 96%  Weight:      Height:        Isolation Precautions No active isolations  Medications Medications  sodium chloride 0.9 % injection (not administered)  0.9 %  sodium chloride infusion (not administered)  sodium chloride 0.9 % bolus 1,000 mL (0 mLs Intravenous Stopped 02/10/18 1326)  lidocaine-EPINEPHrine (XYLOCAINE W/EPI) 2 %-1:200000 (PF) injection 20 mL (20 mLs Infiltration Given by Other 02/10/18 1205)  iopamidol (ISOVUE-300) 61 % injection (100 mLs  Contrast Given 02/10/18 1430)  piperacillin-tazobactam (ZOSYN) IVPB 3.375 g (0 g Intravenous Stopped 02/10/18 1640)     Mobility walks

## 2018-02-10 NOTE — H&P (Signed)
History and Physical    Micahel Bates ZOX:[id number] DOB: [birth year] DOA: 02/10/2018  PCP: [name], MD  Patient coming from: Home.   I have personally briefly reviewed patient's old medical records in [facility]  Chief Complaint: persistent abdominal pain, fever and sent from clinic for elevated liver function tests.   HPI: Manuel Bates is a 52 y.o. male with medical history significant of hypertension, left chest wall abscess/ infected cyst on oral antibiotics at home, comes in for 2 to 3 weeks of persistent fevers, chills,  abdominal pain, associated with nausea, vomiting . Pain in the RUQ , non radiating,. No diarrhea, no hematochezia, dysuria, headache, sensory deficits, weakness. Labs at the office showed elevated liver function tests and was referred to [facility] for admission for evaluation of cholangitis and acute cholecystitis. Pt reports he takes alcohol and continues to smoke. Lab work in ED revealed sodium of 130, creatinine of 1.28. Elevated liver function tests , AST is 100, ALT of 231, lactic acid is 1.11, wbc count of 11.4.   Review of Systems: As per HPI otherwise 10 point review of systems negative.    Past Medical History:  Diagnosis Date  . Hypertension     Past Surgical History:  Procedure Laterality Date  . OTHER SURGICAL HISTORY     sinus surgery     reports that he has been smoking cigarettes.  He has a 55.50 pack-year smoking history. he has never used smokeless tobacco. He reports that he drinks alcohol. He reports that he does not use drugs.  Allergies  Allergen Reactions  . Statins Anxiety    Family history of hypertension, CAD, and cancer present.   Prior to Admission medications   Medication Sig Start Date End Date Taking? Authorizing Provider  aspirin 81 MG chewable tablet Chew 81 mg by mouth daily.   Yes [provider]  azithromycin (ZITHROMAX) 250 MG tablet TAKE 2 TABLETS BY MOUTH EVERY DAY FOR 5 DAYS 02/06/18   Yes [provider]  sulfamethoxazole-trimethoprim (BACTRIM DS,SEPTRA DS) 800-160 MG tablet Take 1 tablet by mouth 2 (two) times daily. for 10 days 02/01/18  Yes [provider]    Physical Exam: Vitals:   02/10/18 1400 02/10/18 1505 02/10/18 1530 02/10/18 1642  BP: 97/64 93/65 96/64  100/67  Pulse: 90 91 94 96  Resp: 18 20 20 18   Temp:    (!) 102.1 F (38.9 C)  TempSrc:    Oral  SpO2: 100% 95% 99% 96%  Weight:      Height:        Constitutional: NAD, calm, comfortable Vitals:   02/10/18 1400 02/10/18 1505 02/10/18 1530 02/10/18 1642  BP: 97/64 93/65 96/64  100/67  Pulse: 90 91 94 96  Resp: 18 20 20 18   Temp:    (!) 102.1 F (38.9 C)  TempSrc:    Oral  SpO2: 100% 95% 99% 96%  Weight:      Height:       Eyes: PERRL, lids and conjunctivae normal ENMT: Mucous membranes are moist. Posterior pharynx clear of any exudate or lesions.Normal dentition.  Neck: normal, supple, no masses, no thyromegaly Respiratory: clear to auscultation bilaterally, no wheezing, no crackles. Normal respiratory effort. No accessory muscle use.  Cardiovascular: Regular rate and rhythm, no murmurs / rubs / gallops. No extremity edema. 2+ pedal pulses. No carotid bruits.  Abdomen: RUQ tenderness without any signs of peritonitis.  Musculoskeletal: no clubbing / cyanosis. No joint deformity upper and lower extremities.  Good ROM, no contractures. Normal muscle tone.  Skin: no rashes, lesions, ulcers. No induration Neurologic: CN 2-12 grossly intact. Sensation intact, DTR normal. Strength 5/5 in all 4.  Psychiatric: Normal judgment and insight. Alert and oriented x 3. Normal mood.     Labs on Admission: I have personally reviewed following labs and imaging studies  CBC: Recent Labs  Lab 02/10/18 1204  WBC 11.4*  HGB 14.7  HCT 40.8  MCV 92.3  PLT 276   Basic Metabolic Panel: Recent Labs  Lab 02/10/18 1204  NA 130*  K 4.0  CL 95*  CO2 26  GLUCOSE 115*  BUN 26*  CREATININE  1.28*  CALCIUM 8.6*   GFR: Estimated Creatinine Clearance: 73.4 mL/min (A) (by C-G formula based on SCr of 1.28 mg/dL (H)). Liver Function Tests: Recent Labs  Lab 02/10/18 1204  AST 100*  ALT 231*  ALKPHOS 258*  BILITOT 6.1*  PROT 7.2  ALBUMIN 3.5   Recent Labs  Lab 02/10/18 1204  LIPASE 26   No results for input(s): AMMONIA in the last 168 hours. Coagulation Profile: No results for input(s): INR, PROTIME in the last 168 hours. Cardiac Enzymes: No results for input(s): CKTOTAL, CKMB, CKMBINDEX, TROPONINI in the last 168 hours. BNP (last 3 results) No results for input(s): PROBNP in the last 8760 hours. HbA1C: No results for input(s): HGBA1C in the last 72 hours. CBG: No results for input(s): GLUCAP in the last 168 hours. Lipid Profile: No results for input(s): CHOL, HDL, LDLCALC, TRIG, CHOLHDL, LDLDIRECT in the last 72 hours. Thyroid Function Tests: No results for input(s): TSH, T4TOTAL, FREET4, T3FREE, THYROIDAB in the last 72 hours. Anemia Panel: No results for input(s): VITAMINB12, FOLATE, FERRITIN, TIBC, IRON, RETICCTPCT in the last 72 hours. Urine analysis:    Component Value Date/Time   COLORURINE AMBER (A) 02/10/2018 1308   APPEARANCEUR CLEAR 02/10/2018 1308   LABSPEC 1.021 02/10/2018 1308   PHURINE 6.0 02/10/2018 1308   GLUCOSEU NEGATIVE 02/10/2018 1308   HGBUR NEGATIVE 02/10/2018 1308   BILIRUBINUR MODERATE (A) 02/10/2018 1308   KETONESUR NEGATIVE 02/10/2018 1308   PROTEINUR 30 (A) 02/10/2018 1308   NITRITE NEGATIVE 02/10/2018 1308   LEUKOCYTESUR NEGATIVE 02/10/2018 1308    Radiological Exams on Admission: Dg Chest 2 View  Result Date: 02/10/2018 CLINICAL DATA:  Fever and malaise EXAM: CHEST - 2 VIEW COMPARISON:  None. FINDINGS: The heart size and mediastinal contours are within normal limits. Both lungs are clear. Mild scarring in the lingula. The visualized skeletal structures are unremarkable. IMPRESSION: No active cardiopulmonary disease.  Electronically Signed   By: Marlan Palauharles  Clark M.D.   On: 02/10/2018 12:48   Ct Abdomen Pelvis W Contrast  Result Date: 02/10/2018 CLINICAL DATA:  52 year old male with a history of elevated LFTs and scleral icterus EXAM: CT ABDOMEN AND PELVIS WITH CONTRAST TECHNIQUE: Multidetector CT imaging of the abdomen and pelvis was performed using the standard protocol following bolus administration of intravenous contrast. CONTRAST:  100mL ISOVUE-300 IOPAMIDOL (ISOVUE-300) INJECTION 61% COMPARISON:  Ultrasound 02/10/2018 FINDINGS: Lower chest: Respiratory motion somewhat limits evaluation of the lungs. Minimal atelectasis/scarring at the dependent aspect of the lung bases. Hepatobiliary: Cranial caudal span of the right liver measures 19.3 cm. Subtle irregularity on the surface of the liver. No focal liver lesion. No periportal edema. Gallbladder relatively decompressed, otherwise unremarkable. Pancreas: Unremarkable pancreas Spleen: Unremarkable spleen Adrenals/Urinary Tract: Unremarkable adrenal glands. Unremarkable right kidney with no hydronephrosis or nephrolithiasis. Unremarkable course of the right ureter. Unremarkable left kidney with no  hydronephrosis or nephrolithiasis. Unremarkable course of the left ureter. Unremarkable urinary bladder Stomach/Bowel: Unremarkable stomach. Unremarkable small bowel without distention. No focal wall thickening. No inflammatory changes of the mesentery. Normal appendix. No abnormally distended colon. No inflammatory changes. No focal colonic wall thickening Vascular/Lymphatic: Mild atherosclerotic changes of the abdominal aorta. No dissection or aneurysm. No periaortic fluid. The iliac and bilateral proximal femoral arteries are patent. Small lymph nodes in the portacaval nodal station, not significantly enlarged. Small lymph nodes in the gastrohepatic ligament, not enlarged. No significant mesenteric adenopathy. No retroperitoneal adenopathy. No inguinal adenopathy. Reproductive:  Internal prostate calcifications with transverse diameter of the prostate measuring 4.2 cm Other: No abdominal wall hernia Musculoskeletal: No acute displaced fracture. Mild degenerative changes of the spine. Mild degenerative changes of the hips. IMPRESSION: No acute CT finding. There is subtle irregularity of the liver contour without other CT evidence of cirrhosis. Gallbladder is decompressed without cholelithiasis. No evidence of biliary dilatation. Aortic Atherosclerosis (ICD10-I70.0). Electronically Signed   By: Gilmer Mor D.O.   On: 02/10/2018 14:53   US Abdomen Limited Ruq  Result Date: 02/10/2018 CLINICAL DATA:  Upper abdominal pain.  Elevated liver enzymes. EXAM: ULTRASOUND ABDOMEN LIMITED RIGHT UPPER QUADRANT COMPARISON:  None. FINDINGS: Gallbladder: Gallbladder wall thickening to 4 mm with small crescent of edema around the fundus. Given negative Murphy sign, no stones, and limited distension this is likely reactive thickening rather than from cholecystitis. Common bile duct: Diameter: 5 mm Liver: Echogenic diffusely with gallbladder fossa sparing. No focal lesion is seen. Portal vein is patent on color Doppler imaging with normal direction of blood flow towards the liver. IMPRESSION: 1. Gallbladder wall thickening and mild pericholecystic edema that is likely reactive, as above. 2. Hepatic steatosis. Electronically Signed   By: Marnee Spring M.D.   On: 02/10/2018 12:40    EKG: not done.   Assessment/Plan Active Problems:   Acute cholecystitis  Nausea, vomiting, RUQ Pain, fevers and chills. - ? Acute vs chronic cholecystitis.  - CT abd and pelvis is not significant. CBD not dilated.  - [place] ABD shows non specific peri cholecystic edema.  Surgery and GI consulted for recommendations.  Zosyn for possible cholangitis.  Monitor liver function test.     Hypertension:  bp parameters borderline. Hold home bp meds.    Tobacco abuse:  Started on nicotine patch.   Left chest wall  infected cyst/ abscess:  Drained today and fluid sent for analysis.   DVT prophylaxis: lovenox.  Code Status: full code.  Family Communication: wife at bedside.  Disposition Plan: pending resolution of abd pain.  Consults called: surgery/ GI.  Admission status: inpatient/ med surg.    Kathlen Mody MD [facility] Pager [phone]  If 7PM-7AM, please contact night-coverage [email or web address] Password [email or web address]  02/10/2018, 5:10 PM

## 2018-02-10 NOTE — Progress Notes (Signed)
Pt gave permission for his wife to be in the room while questions were asked on the nursing admission hx. Manuel Bates. Carleane Anthany Thornhill BSN, RN-BC Admissions RN 02/10/2018 4:50 PM

## 2018-02-10 NOTE — ED Provider Notes (Addendum)
[facility]-EMERGENCY DEPT Provider Note   CSN: [id number] Arrival date & time: 02/10/18  [id number]     History   Chief Complaint Chief Complaint  Patient presents with  . sent for CT scan  . elevated liver enzymes    HPI Manuel Bates is a 52 y.o. male.  HPI 52 year old male presents the emergency department from an outside clinic with elevated liver function test.  He states over the past 2 weeks he has had increased malaise and anorexia.  He has been more tired.  Is been sleeping more often.  He has had documented fevers at home up to 102.8.  He was originally seen and evaluated for an area on his left breast which was concerned to be cellulitic and was started on Bactrim.  As his infection in his left breast continued to improve yet he continued to have fever and generalized symptoms he was started on additional antibiotics including azithromycin and given a dose of Rocephin in the clinic.  He does report some right upper quadrant abdominal pain at this time.  No significant shortness of breath.  Denies cough.  No dysuria or urinary frequency.  No back pain or flank pain.  No vomiting.  No blood in his stool.  Patient was found to have elevated liver function test today and sent to the ER for further evaluation.  Dr. [name] with gastroenterology is aware of the patient   Past Medical History:  Diagnosis Date  . Hypertension     There are no active problems to display for this patient.   History reviewed. No pertinent surgical history.     Home Medications    Prior to Admission medications   Medication Sig Start Date End Date Taking? Authorizing Provider  aspirin 81 MG chewable tablet Chew 81 mg by mouth daily.   Yes [provider]  azithromycin (ZITHROMAX) 250 MG tablet TAKE 2 TABLETS BY MOUTH EVERY DAY FOR 5 DAYS 02/06/18  Yes [provider]  sulfamethoxazole-trimethoprim (BACTRIM DS,SEPTRA DS) 800-160 MG tablet Take 1 tablet by  mouth 2 (two) times daily. for 10 days 02/01/18  Yes [provider]    Family History No family history on file.  Social History Social History   Tobacco Use  . Smoking status: Current Every Day Smoker    Types: Cigarettes  . Smokeless tobacco: Never Used  Substance Use Topics  . Alcohol use: Yes  . Drug use: Not on file     Allergies   Statins   Review of Systems Review of Systems  All other systems reviewed and are negative.    Physical Exam Updated Vital Signs BP 96/64   Pulse 94   Temp 99.5 F (37.5 C) (Oral)   Resp 20   Ht 5\' 9"  (1.753 m)   Wt 86.2 kg (190 lb)   SpO2 99%   BMI 28.06 kg/m   Physical Exam  Constitutional: He is oriented to person, place, and time. He appears well-developed and well-nourished.  HENT:  Head: Normocephalic and atraumatic.  Eyes: EOM are normal.  Neck: Normal range of motion.  Cardiovascular: Normal rate, regular rhythm, normal heart sounds and intact distal pulses.  Pulmonary/Chest: Effort normal and breath sounds normal. No respiratory distress.  Abdominal: Soft. He exhibits no distension.  Mild right upper quadrant pain  Musculoskeletal: Normal range of motion.  Neurological: He is alert and oriented to person, place, and time.  Skin: Skin is warm and dry.  Jaundice  Psychiatric:  He has a normal mood and affect. Judgment normal.  Nursing note and vitals reviewed.    ED Treatments / Results  Labs (all labs ordered are listed, but only abnormal results are displayed) Labs Reviewed  CBC - Abnormal; Notable for the following components:      Result Value   WBC 11.4 (*)    All other components within normal limits  COMPREHENSIVE METABOLIC PANEL - Abnormal; Notable for the following components:   Sodium 130 (*)    Chloride 95 (*)    Glucose, Bld 115 (*)    BUN 26 (*)    Creatinine, Ser 1.28 (*)    Calcium 8.6 (*)    AST 100 (*)    ALT 231 (*)    Alkaline Phosphatase 258 (*)    Total Bilirubin 6.1 (*)     All other components within normal limits  URINALYSIS, ROUTINE W REFLEX MICROSCOPIC - Abnormal; Notable for the following components:   Color, Urine AMBER (*)    Bilirubin Urine MODERATE (*)    Protein, ur 30 (*)    Squamous Epithelial / LPF 0-5 (*)    All other components within normal limits  AEROBIC CULTURE (SUPERFICIAL SPECIMEN)  LIPASE, BLOOD  I-STAT CG4 LACTIC ACID, ED    EKG  EKG Interpretation None       Radiology Dg Chest 2 View  Result Date: 02/10/2018 CLINICAL DATA:  Fever and malaise EXAM: CHEST - 2 VIEW COMPARISON:  None. FINDINGS: The heart size and mediastinal contours are within normal limits. Both lungs are clear. Mild scarring in the lingula. The visualized skeletal structures are unremarkable. IMPRESSION: No active cardiopulmonary disease. Electronically Signed   By: Marlan Palau M.D.   On: 02/10/2018 12:48   Ct Abdomen Pelvis W Contrast  Result Date: 02/10/2018 CLINICAL DATA:  53 year old male with a history of elevated LFTs and scleral icterus EXAM: CT ABDOMEN AND PELVIS WITH CONTRAST TECHNIQUE: Multidetector CT imaging of the abdomen and pelvis was performed using the standard protocol following bolus administration of intravenous contrast. CONTRAST:  ISOVUE-300 IOPAMIDOL (ISOVUE-300) INJECTION 61% COMPARISON:  Ultrasound 02/10/2018 FINDINGS: Lower chest: Respiratory motion somewhat limits evaluation of the lungs. Minimal atelectasis/scarring at the dependent aspect of the lung bases. Hepatobiliary: Cranial caudal span of the right liver measures 19.3 cm. Subtle irregularity on the surface of the liver. No focal liver lesion. No periportal edema. Gallbladder relatively decompressed, otherwise unremarkable. Pancreas: Unremarkable pancreas Spleen: Unremarkable spleen Adrenals/Urinary Tract: Unremarkable adrenal glands. Unremarkable right kidney with no hydronephrosis or nephrolithiasis. Unremarkable course of the right ureter. Unremarkable left kidney with no  hydronephrosis or nephrolithiasis. Unremarkable course of the left ureter. Unremarkable urinary bladder Stomach/Bowel: Unremarkable stomach. Unremarkable small bowel without distention. No focal wall thickening. No inflammatory changes of the mesentery. Normal appendix. No abnormally distended colon. No inflammatory changes. No focal colonic wall thickening Vascular/Lymphatic: Mild atherosclerotic changes of the abdominal aorta. No dissection or aneurysm. No periaortic fluid. The iliac and bilateral proximal femoral arteries are patent. Small lymph nodes in the portacaval nodal station, not significantly enlarged. Small lymph nodes in the gastrohepatic ligament, not enlarged. No significant mesenteric adenopathy. No retroperitoneal adenopathy. No inguinal adenopathy. Reproductive: Internal prostate calcifications with transverse diameter of the prostate measuring 4.2 cm Other: No abdominal wall hernia Musculoskeletal: No acute displaced fracture. Mild degenerative changes of the spine. Mild degenerative changes of the hips. IMPRESSION: No acute CT finding. There is subtle irregularity of the liver contour without other CT evidence of cirrhosis. Gallbladder is  decompressed without cholelithiasis. No evidence of biliary dilatation. Aortic Atherosclerosis (ICD10-I70.0). Electronically Signed   By: Gilmer Mor D.O.   On: 02/10/2018 14:53   US Abdomen Limited Ruq  Result Date: 02/10/2018 CLINICAL DATA:  Upper abdominal pain.  Elevated liver enzymes. EXAM: ULTRASOUND ABDOMEN LIMITED RIGHT UPPER QUADRANT COMPARISON:  None. FINDINGS: Gallbladder: Gallbladder wall thickening to 4 mm with small crescent of edema around the fundus. Given negative Murphy sign, no stones, and limited distension this is likely reactive thickening rather than from cholecystitis. Common bile duct: Diameter: 5 mm Liver: Echogenic diffusely with gallbladder fossa sparing. No focal lesion is seen. Portal vein is patent on color Doppler imaging  with normal direction of blood flow towards the liver. IMPRESSION: 1. Gallbladder wall thickening and mild pericholecystic edema that is likely reactive, as above. 2. Hepatic steatosis. Electronically Signed   By: Marnee Spring M.D.   On: 02/10/2018 12:40    Procedures .Marland KitchenIncision and Drainage Performed by: Azalia Bilis, MD Authorized by: Azalia Bilis, MD     INCISION AND DRAINAGE Performed by: Azalia Bilis Consent: Verbal consent obtained. Risks and benefits: risks, benefits and alternatives were discussed Time out performed prior to procedure Type: abscess Body area: left breast Anesthesia: local infiltration Incision was made with a scalpel. Local anesthetic: lidocaine 2% with epinephrine Anesthetic total: 7 ml Complexity: complex Blunt dissection to break up loculations Drainage: purulent Drainage amount: large Packing material: none Patient tolerance: Patient tolerated the procedure well with no immediate complications.     Medications Ordered in ED Medications  sodium chloride 0.9 % injection (not administered)  piperacillin-tazobactam (ZOSYN) IVPB 3.375 g (3.375 g Intravenous New Bag/Given 02/10/18 1604)  sodium chloride 0.9 % bolus 1,000 mL (0 mLs Intravenous Stopped 02/10/18 1326)  lidocaine-EPINEPHrine (XYLOCAINE W/EPI) 2 %-1:200000 (PF) injection 20 mL (20 mLs Infiltration Given by Other 02/10/18 1205)  iopamidol (ISOVUE-300) 61 % injection (100 mLs  Contrast Given 02/10/18 1430)     Initial Impression / Assessment and Plan / ED Course  I have reviewed the triage vital signs and the nursing notes.  Pertinent labs & imaging results that were available during my care of the patient were reviewed by me and considered in my medical decision making (see chart for details).     Patient with mild right upper quadrant pain.  May represent chronic cholecystitis.  IV Zosyn now.  Patient seen and evaluated by Dr. Elnoria Howard who agrees with general surgery evaluation.  Patient  seen and evaluated by general surgery and a HIDA scan will be performed.  Patient will be admitted to hospital via hospitalist service.  Soft blood pressure noted.  We will add a second liter of IV fluids at this time.  We will add blood cultures and lactate.  Final Clinical Impressions(s) / ED Diagnoses   Final diagnoses:  Elevated liver enzymes  Upper abdominal pain    ED Discharge Orders    None       Azalia Bilis, MD 02/10/18 1631    Azalia Bilis, MD 02/13/18 [phone]

## 2018-02-10 NOTE — ED Notes (Signed)
[facility]

## 2018-02-10 NOTE — ED Notes (Signed)
Report given Beth,RN

## 2018-02-11 ENCOUNTER — Other Ambulatory Visit: Payer: Self-pay

## 2018-02-11 DIAGNOSIS — K810 Acute cholecystitis: Secondary | ICD-10-CM

## 2018-02-11 DIAGNOSIS — R5381 Other malaise: Secondary | ICD-10-CM

## 2018-02-11 DIAGNOSIS — L818 Other specified disorders of pigmentation: Secondary | ICD-10-CM

## 2018-02-11 DIAGNOSIS — F17210 Nicotine dependence, cigarettes, uncomplicated: Secondary | ICD-10-CM

## 2018-02-11 DIAGNOSIS — K719 Toxic liver disease, unspecified: Secondary | ICD-10-CM

## 2018-02-11 DIAGNOSIS — H5713 Ocular pain, bilateral: Secondary | ICD-10-CM

## 2018-02-11 DIAGNOSIS — R531 Weakness: Secondary | ICD-10-CM

## 2018-02-11 DIAGNOSIS — R748 Abnormal levels of other serum enzymes: Secondary | ICD-10-CM

## 2018-02-11 DIAGNOSIS — R5383 Other fatigue: Secondary | ICD-10-CM

## 2018-02-11 DIAGNOSIS — R509 Fever, unspecified: Secondary | ICD-10-CM

## 2018-02-11 DIAGNOSIS — R300 Dysuria: Secondary | ICD-10-CM

## 2018-02-11 DIAGNOSIS — K7589 Other specified inflammatory liver diseases: Secondary | ICD-10-CM

## 2018-02-11 DIAGNOSIS — R1010 Upper abdominal pain, unspecified: Secondary | ICD-10-CM

## 2018-02-11 DIAGNOSIS — H53149 Visual discomfort, unspecified: Secondary | ICD-10-CM

## 2018-02-11 DIAGNOSIS — R51 Headache: Secondary | ICD-10-CM

## 2018-02-11 DIAGNOSIS — N6002 Solitary cyst of left breast: Secondary | ICD-10-CM

## 2018-02-11 LAB — COMPREHENSIVE METABOLIC PANEL
ALT: 192 U/L — ABNORMAL HIGH (ref 17–63)
ANION GAP: 10 (ref 5–15)
AST: 91 U/L — ABNORMAL HIGH (ref 15–41)
Albumin: 2.7 g/dL — ABNORMAL LOW (ref 3.5–5.0)
Alkaline Phosphatase: 222 U/L — ABNORMAL HIGH (ref 38–126)
BILIRUBIN TOTAL: 5.9 mg/dL — AB (ref 0.3–1.2)
BUN: 22 mg/dL — ABNORMAL HIGH (ref 6–20)
CHLORIDE: 101 mmol/L (ref 101–111)
CO2: 24 mmol/L (ref 22–32)
Calcium: 8 mg/dL — ABNORMAL LOW (ref 8.9–10.3)
Creatinine, Ser: 1.09 mg/dL (ref 0.61–1.24)
GFR calc Af Amer: >60 mL/min (ref >60)
Glucose, Bld: 102 mg/dL — ABNORMAL HIGH (ref 65–99)
Potassium: 3.9 mmol/L (ref 3.5–5.1)
Sodium: 135 mmol/L (ref 135–145)
TOTAL PROTEIN: 6 g/dL — AB (ref 6.5–8.1)

## 2018-02-11 LAB — PROTIME-INR
INR: 1.18
PROTHROMBIN TIME: 15 s (ref 11.4–15.2)

## 2018-02-11 LAB — HIV ANTIBODY (ROUTINE TESTING W REFLEX): HIV SCREEN 4TH GENERATION: NONREACTIVE

## 2018-02-11 MED ORDER — LEVOFLOXACIN 750 MG PO TABS
750.0000 mg | ORAL_TABLET | Freq: Every day | ORAL | Status: DC
  Administered 2018-02-12 – 2018-02-13 (×2): 750 mg via ORAL
  Filled 2018-02-11 (×2): qty 1

## 2018-02-11 MED ORDER — PIPERACILLIN-TAZOBACTAM 3.375 G IVPB 30 MIN: 3.3750 g | Freq: Four times a day (QID) | INTRAVENOUS | Status: DC

## 2018-02-11 MED ORDER — DOXYCYCLINE HYCLATE 100 MG PO TABS
100.0000 mg | ORAL_TABLET | Freq: Two times a day (BID) | ORAL | Status: DC
  Administered 2018-02-11: 100 mg via ORAL
  Filled 2018-02-11: qty 1

## 2018-02-11 MED ORDER — DOXYCYCLINE HYCLATE 100 MG PO TABS
100.0000 mg | ORAL_TABLET | Freq: Two times a day (BID) | ORAL | Status: DC
  Administered 2018-02-11 – 2018-02-13 (×4): 100 mg via ORAL
  Filled 2018-02-11 (×4): qty 1

## 2018-02-11 MED ORDER — LEVOFLOXACIN 750 MG PO TABS
750.0000 mg | ORAL_TABLET | Freq: Every day | ORAL | Status: DC
  Administered 2018-02-11: 750 mg via ORAL
  Filled 2018-02-11: qty 1

## 2018-02-11 NOTE — Progress Notes (Signed)
HISTORY OF PRESENT ILLNESS:  Manuel Bates is a 52 y.o. male admitted with jaundice and vague upper abdominal discomfort. No further abdominal pain. Liver tests slightly improved. No new complaints.  REVIEW OF SYSTEMS:  All non-GI ROS negative except for fatigue  Past Medical History:  Diagnosis Date  . Hypertension     Past Surgical History:  Procedure Laterality Date  . OTHER SURGICAL HISTORY     sinus surgery    Social History Manuel Bates  reports that he has been smoking cigarettes.  He has a 55.50 pack-year smoking history. he has never used smokeless tobacco. He reports that he drinks alcohol. He reports that he does not use drugs.  family history is not on file.  Allergies  Allergen Reactions  . Statins Anxiety       PHYSICAL EXAMINATION: Vital signs: BP 94/69 (BP Location: Right Arm)   Pulse 89   Temp 99.2 F (37.3 C) (Oral)   Resp 18   Ht 5\' 9"  (1.753 m)   Wt 183 lb 13.8 oz (83.4 kg)   SpO2 96%   BMI 27.15 kg/m   Constitutional: jaundiced but generally well-appearing, no acute distress Psychiatric: alert and oriented x3, cooperative Eyes: extraocular movements intact, anicteric, conjunctiva pink Mouth: oral pharynx moist, no lesions Neck: supple no lymphadenopathy Cardiovascular: heart regular rate and rhythm, no murmur Lungs: clear to auscultation bilaterally Abdomen: soft, nontender, nondistended, no obvious ascites, no peritoneal signs, normal bowel sounds, no organomegaly Rectal:omitted Extremities: no clubbing, cyanosis, or lower extremity edema bilaterally Skin: no lesions on visible extremities Neuro: No focal deficits. No asterixis.    ASSESSMENT:  #1. Elevated liver tests. I suspect drug-induced liver injury. Hepatic synthetic function intact. No abdominal pain. fever may be secondary to the same.   PLAN:  #1. Continue to monitor liver tests and prothrombin time. If the patient were to develop recurrent significant  abdominal pain then surgery could reconsider gallbladder disease. Discussed with patient and Dr. Thedore MinsSingh. Diet as tolerated. Repeat blood work in a.m.Manuel Bates.  Manuel Bates, Jr., M.D. [facility] Division of Gastroenterology

## 2018-02-11 NOTE — Consult Note (Signed)
Date of Admission:  02/10/2018          Reason for Consult: Acute cholestatic liver injury in context of fever, antibiotics    Referring Provider: Dr. Daphine Deutscher   Assessment: 1. Acute cholestatic liver injury, possibly due to one of the antibiotics he was given such as bactrim, azithromycin (vs less likely PCN), vs due to cholecystitis (clinically seems less likely) 2. Infected cyst left breast sp I and D with GP c pairs seen 3. Hepatosteatosis on US liver 4. Tattoo  Plan: 1. DC zosyn in case a penicillin could have been causing DILI 2. Change to levaquin to cover for cholangitis (I am underwhelmed for this diagnosis) and doxycycline to cover for possible MRSA in cyst 3. followup on hepatitis serologies 4. Follow LFTS's closely 5. CCS following in case this turns out to actually be due to bilary tract disease and in case he requires cholecystectomy 6. Consider GI/Hepatology consult or at least discussion of the case with them 7. Make sure viral hepatides have been checked  Active Problems:   Acute cholecystitis   Scheduled Meds: . doxycycline  100 mg Oral Q12H  . enoxaparin (LOVENOX) injection  40 mg Subcutaneous Q24H  . levofloxacin  750 mg Oral Daily  . nicotine  21 mg Transdermal Daily   Continuous Infusions: . sodium chloride 75 mL/hr at 02/11/18 0926   PRN Meds:.acetaminophen **OR** acetaminophen, ondansetron **OR** ondansetron (ZOFRAN) IV  HPI: Manuel Bates is a 52 y.o. 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 At that time this area was not overtly infected and was stable. However within the past two weeks he developed intense erythema in this area with swelling. He had subjective and then objective fevers above 102 with drenching night sweats. He was also having pain behnid his eyes with burning sensation there, headaches and pain when he urinated when he was having fevers. He sought  care with his PCP who prescribed Bactrim which he began taking on March 6, but because his symptoms were not improving and lesion not resolving he was given a shot of IM PCN and azithromycin which was started on March 11th. In the interim he noticed his urine turn dark orange, jaundice and icterus. The admission note states he had abdominal pain and nausea. He had labs done which showed a cholestatic hepatitis and he was referred to the ED. He underwent I and D of the cyst in the ER and cultures are incubating with GP pairs seen on GS. He has been placed on zosyn. His US abdomen showed some GB wall thickening but CT scan was completely unremarkable for biliary pathology as was HIDA scan.   He has an UNDER [facility] abdominal exam with absolutely no tenderness to deep palpation.   I suspect his cholestatic hepatitis is due to a drug induced liver injury either from the bactrim or the azithromycin. Penicillin would seem a less likely cause but is possible and therefore I will take him off the piperacillin.   I will change him to levaquin in case he has cholangitis--which I doubt and doxycyline to cover for possible MRSA in cyst.   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    Review of Systems: Review of Systems  Constitutional: Positive for chills, diaphoresis, fever and malaise/fatigue. Negative for weight loss.  HENT: Negative for congestion, hearing loss, sore throat and tinnitus.   Eyes: Positive for photophobia and pain. Negative for blurred vision and double vision.  Respiratory: Negative for cough, sputum production, shortness of breath and wheezing.   Cardiovascular: Negative for chest pain, palpitations and leg swelling.  Gastrointestinal: Negative for  abdominal pain, blood in stool, constipation, diarrhea, heartburn, melena, nausea and vomiting.  Genitourinary: Positive for dysuria. Negative for flank pain and hematuria.  Musculoskeletal: Negative for back pain, falls, joint pain and myalgias.  Skin: Negative for itching and rash.  Neurological: Positive for weakness and headaches. Negative for dizziness, sensory change, focal weakness and loss of consciousness.  Endo/Heme/Allergies: Does not bruise/bleed easily.  Psychiatric/Behavioral: Negative for depression, memory loss and suicidal ideas. The patient is not nervous/anxious.     Past Medical History:  Diagnosis Date  . Hypertension     Social History   Tobacco Use  . Smoking status: Current Every Day Smoker    Packs/day: 1.50    Years: 37.00    Pack years: 55.50    Types: Cigarettes  . Smokeless tobacco: Never Used  Substance Use Topics  . Alcohol use: Yes    Comment: 6 pack per day on weekends  . Drug use: No    History reviewed. No pertinent family history. Allergies  Allergen Reactions  . Statins Anxiety    OBJECTIVE: Blood pressure 94/69, pulse 89, temperature 99.2 F (37.3 C), temperature source Oral, resp. rate 18, height 5\' 9"  (1.753 m), weight 183 lb 13.8 oz (83.4 kg), SpO2 96 %.  Physical Exam  Constitutional: He is oriented to person, place, and time and well-developed, well-nourished, and in no distress. He appears jaundiced. No distress.  HENT:  Head: Normocephalic and atraumatic.  Right Ear: External ear normal.  Left Ear: External ear normal.  Nose: Nose normal.  Mouth/Throat: Oropharynx is clear and moist. No oropharyngeal exudate.  Eyes: Conjunctivae and EOM are normal. Pupils are equal, round, and reactive to light. Scleral icterus is present.  Neck: Normal range of motion. Neck supple.  Cardiovascular: Normal rate, regular rhythm and normal heart sounds. Exam reveals no gallop and no friction rub.  No murmur heard. Pulmonary/Chest: Effort  normal and breath sounds normal. No respiratory distress. He has no wheezes. He has no rales.  Abdominal: Soft. Bowel sounds are normal. He exhibits no distension and no mass. There is no tenderness. There is no rebound and no guarding.  Musculoskeletal: Normal range of motion. He exhibits no edema or tenderness.  Lymphadenopathy:    He has no cervical adenopathy.  Neurological: He is alert and oriented to person, place, and time. Coordination normal.  Skin: Skin is warm and dry. No rash noted. He is not diaphoretic. No erythema. No pallor.  Psychiatric: Mood, memory, affect and judgment normal.   Tattoo on right arm  Lab Results Lab Results  Component Value Date   WBC 11.4 (H) 02/10/2018   HGB 14.7 02/10/2018   HCT 40.8 02/10/2018   MCV 92.3 02/10/2018   PLT 276 02/10/2018    Lab Results  Component Value Date   CREATININE 1.09 02/11/2018   BUN 22 (H) 02/11/2018   NA 135 02/11/2018   K 3.9 02/11/2018   CL 101 02/11/2018   CO2 24 02/11/2018    Lab Results  Component Value Date  ALT 192 (H) 02/11/2018   AST 91 (H) 02/11/2018   ALKPHOS 222 (H) 02/11/2018   BILITOT 5.9 (H) 02/11/2018     Microbiology: Recent Results (from the past 240 hour(s))  Wound or Superficial Culture     Status: None (Preliminary result)   Collection Time: 02/10/18  4:01 PM  Result Value Ref Range Status   Specimen Description   Final    ABSCESS LEFT BREAST Performed at [facility], [address] [place], [place] [address]    Special Requests   Final    NONE Performed at [facility], [address] [place], [place] [address]    Gram Stain   Final    FEW WBC PRESENT,BOTH PMN AND MONONUCLEAR MODERATE GRAM POSITIVE COCCI IN PAIRS    Culture   Final    NO GROWTH 1 DAY Performed at [facility], [address] [place], [place] [address]    Report Status PENDING  Incomplete  Blood culture (routine x 2)     Status: None (Preliminary result)    Collection Time: 02/10/18  4:47 PM  Result Value Ref Range Status   Specimen Description   Final    BLOOD LEFT HAND Performed at [facility], [address] [place], [place] [address]    Special Requests   Final    BOTTLES DRAWN AEROBIC AND ANAEROBIC Blood Culture adequate volume Performed at [facility], [address] [place], [place] [address]    Culture PENDING  Incomplete   Report Status PENDING  Incomplete    Acey Lavornelius Van Dam, MD [facility] [phone] pager  02/11/2018, 1:06 PM

## 2018-02-11 NOTE — Progress Notes (Signed)
@IPLOG @        PROGRESS NOTE                                                                                                                                                                                                             Patient Demographics:    Manuel Bates, is a 52 y.o. male, DOB - 25-Oct-1966, WJX:[id number]  Admit date - 02/10/2018   Admitting Physician Kathlen Mody, MD  Outpatient Primary MD for the patient is [name], MD  LOS - 1  Chief Complaint  Patient presents with  . sent for CT scan  . elevated liver enzymes       Brief Narrative   Manuel Bates is a 52 y.o. male with medical history significant of hypertension, left chest wall abscess/ infected cyst on oral antibiotics at home, comes in for 2 to 3 weeks of persistent fevers, chills,  abdominal pain, associated with nausea, vomiting . Pain in the RUQ , workup revealed elevated liver enzymes along with bilirubin with HIDA scan being equivocal.   Subjective:    Manuel Bates today has, No headache, No chest pain, No abdominal pain - No Nausea, No new weakness tingling or numbness, No Cough - SOB.     Assessment  & Plan :     1.  Possible chronic cholecystitis.  General surgery and GI on board, CT scan unrevealing but on ultrasound there was edema around the gallbladder although he did not show any gallstones or CBD stones, HIDA scan was equivocal more consistent with chronic cholecystitis.  Currently on bowel rest, empiric antibiotics which currently on Levaquin and doxycycline per ID.  General surgery and GI following.  Patient currently not tender in the right upper quadrant clinically no acute cholecystitis.  HIV & Acute hepatitis panel pending. There is ? if this is drug induced Liver injury due to recent outpatient antibiotic exposure.   2.  History of alcohol binging during weekends.  Counseled to quit.  Does not drink on a daily basis will monitor for any signs of withdrawals.  3.  Recent  chest wall abscess/cyst infection.  Currently on doxycycline per ID.     Diet : Diet NPO time specified Except for: Sips with Meds    Family Communication  :  None  Code Status :  Full  Disposition Plan  :  TBD  Consults  :  GI, CCS  Procedures  :      DVT Prophylaxis  :  Lovenox   Lab Results  Component Value Date   PLT 276 02/10/2018    Inpatient Medications  Scheduled Meds: . enoxaparin (LOVENOX) injection  40 mg Subcutaneous Q24H  . nicotine  21 mg Transdermal Daily   Continuous Infusions: . sodium chloride 75 mL/hr at 02/11/18 0926   PRN Meds:.acetaminophen **OR** [DISCONTINUED] acetaminophen, ondansetron **OR** ondansetron (ZOFRAN) IV  Antibiotics  :    Anti-infectives (From admission, onward)   Start     Dose/Rate Route Frequency Ordered Stop   02/11/18 1400  levofloxacin (LEVAQUIN) tablet 750 mg  Status:  Discontinued     750 mg Oral Daily 02/11/18 1219 02/11/18 1330   02/11/18 1400  doxycycline (VIBRA-TABS) tablet 100 mg  Status:  Discontinued     100 mg Oral Every 12 hours 02/11/18 1219 02/11/18 1330   02/11/18 1345  piperacillin-tazobactam (ZOSYN) IVPB 3.375 g  Status:  Discontinued     3.375 g 100 mL/hr over 30 Minutes Intravenous Every 6 hours 02/11/18 1330 02/11/18 1331   02/11/18 0000  piperacillin-tazobactam (ZOSYN) IVPB 3.375 g  Status:  Discontinued     3.375 g 12.5 mL/hr over 240 Minutes Intravenous Every 8 hours 02/10/18 2352 02/11/18 1217   02/10/18 1600  piperacillin-tazobactam (ZOSYN) IVPB 3.375 g     3.375 g 100 mL/hr over 30 Minutes Intravenous  Once 02/10/18 1555 02/10/18 1640         Objective:   Vitals:   02/10/18 1642 02/10/18 1900 02/10/18 2041 02/11/18 0600  BP: 100/67 99/67  94/69  Pulse: 96 95  89  Resp: 18 18  18   Temp: (!) 102.1 F (38.9 C) 100 F (37.8 C)  99.2 F (37.3 C)  TempSrc: Oral Oral  Oral  SpO2: 96% 95%  96%  Weight:   83.4 kg (183 lb 13.8 oz)   Height:   5\' 9"  (1.753 m)     Wt Readings from Last  3 Encounters:  02/10/18 83.4 kg (183 lb 13.8 oz)     Intake/Output Summary (Last 24 hours) at 02/11/2018 1331 Last data filed at 02/11/2018 1000 Gross per 24 hour  Intake 1519.16 ml  Output 250 ml  Net 1269.16 ml     Physical Exam  Awake Alert, Oriented X 3, No new F.N deficits, Normal affect [place].AT,PERRAL Supple Neck,No JVD, No cervical lymphadenopathy appriciated.  Symmetrical Chest wall movement, Good air movement bilaterally, CTAB RRR,No Gallops,Rubs or new Murmurs, No Parasternal Heave +ve B.Sounds, Abd Soft, No tenderness, No organomegaly appriciated, No rebound - guarding or rigidity. No Cyanosis, Clubbing or edema, No new Rash or bruise      Data Review:    CBC Recent Labs  Lab 02/10/18 1204  WBC 11.4*  HGB 14.7  HCT 40.8  PLT 276  MCV 92.3  MCH 33.3  MCHC 36.0  RDW 12.1    Chemistries  Recent Labs  Lab 02/10/18 1204 02/11/18 0502  NA 130* 135  K 4.0 3.9  CL 95* 101  CO2 26 24  GLUCOSE 115* 102*  BUN 26* 22*  CREATININE 1.28* 1.09  CALCIUM 8.6* 8.0*  AST 100* 91*  ALT 231* 192*  ALKPHOS 258* 222*  BILITOT 6.1* 5.9*   ------------------------------------------------------------------------------------------------------------------ No results for input(s): CHOL, HDL, LDLCALC, TRIG, CHOLHDL, LDLDIRECT in the last 72 hours.  No results found for: HGBA1C ------------------------------------------------------------------------------------------------------------------ No results for input(s): TSH, T4TOTAL, T3FREE, THYROIDAB in the last 72 hours.  Invalid input(s): FREET3 ------------------------------------------------------------------------------------------------------------------ No results for input(s): VITAMINB12, FOLATE, FERRITIN, TIBC, IRON, RETICCTPCT in the last 72 hours.  Coagulation profile  Recent Labs  Lab 02/11/18 0930  INR 1.18    No results for input(s): DDIMER in the last 72 hours.  Cardiac Enzymes No results for  input(s): CKMB, TROPONINI, MYOGLOBIN in the last 168 hours.  Invalid input(s): CK ------------------------------------------------------------------------------------------------------------------ No results found for: BNP  Micro Results Recent Results (from the past 240 hour(s))  Wound or Superficial Culture     Status: None (Preliminary result)   Collection Time: 02/10/18  4:01 PM  Result Value Ref Range Status   Specimen Description   Final    ABSCESS LEFT BREAST Performed at [facility], [address] [place], [place] [address]    Special Requests   Final    NONE Performed at [facility], [address] [place], [place] [address]    Gram Stain   Final    FEW WBC PRESENT,BOTH PMN AND MONONUCLEAR MODERATE GRAM POSITIVE COCCI IN PAIRS    Culture   Final    NO GROWTH 1 DAY Performed at [facility], [address] [place], [place] [address]    Report Status PENDING  Incomplete  Blood culture (routine x 2)     Status: None (Preliminary result)   Collection Time: 02/10/18  4:47 PM  Result Value Ref Range Status   Specimen Description   Final    BLOOD LEFT HAND Performed at [facility], [address] [place], [place] [address]    Special Requests   Final    BOTTLES DRAWN AEROBIC AND ANAEROBIC Blood Culture adequate volume Performed at [facility], [address] [place], [place] [address]    Culture PENDING  Incomplete   Report Status PENDING  Incomplete    Radiology Reports Dg Chest 2 View  Result Date: 02/10/2018 CLINICAL DATA:  Fever and malaise EXAM: CHEST - 2 VIEW COMPARISON:  None. FINDINGS: The heart size and mediastinal contours are within normal limits. Both lungs are clear. Mild scarring in the lingula. The visualized skeletal structures are unremarkable. IMPRESSION: No active cardiopulmonary disease. Electronically Signed   By: Marlan Palauharles  Clark M.D.   On: 02/10/2018 12:48   Ct Abdomen  Pelvis W Contrast  Result Date: 02/10/2018 CLINICAL DATA:  52 year old male with a history of elevated LFTs and scleral icterus EXAM: CT ABDOMEN AND PELVIS WITH CONTRAST TECHNIQUE: Multidetector CT imaging of the abdomen and pelvis was performed using the standard protocol following bolus administration of intravenous contrast. CONTRAST:  100mL ISOVUE-300 IOPAMIDOL (ISOVUE-300) INJECTION 61% COMPARISON:  Ultrasound 02/10/2018 FINDINGS: Lower chest: Respiratory motion somewhat limits evaluation of the lungs. Minimal atelectasis/scarring at the dependent aspect of the lung bases. Hepatobiliary: Cranial caudal span of the right liver measures 19.3 cm. Subtle irregularity on the surface of the liver. No focal liver lesion. No periportal edema. Gallbladder relatively decompressed, otherwise unremarkable. Pancreas: Unremarkable pancreas Spleen: Unremarkable spleen Adrenals/Urinary Tract: Unremarkable adrenal glands. Unremarkable right kidney with no hydronephrosis or nephrolithiasis. Unremarkable course of the right ureter. Unremarkable left kidney with no hydronephrosis or nephrolithiasis. Unremarkable course of the left ureter. Unremarkable urinary bladder Stomach/Bowel: Unremarkable stomach. Unremarkable small bowel without distention. No focal wall thickening. No inflammatory changes of the mesentery. Normal appendix. No abnormally distended colon. No inflammatory changes. No focal colonic wall thickening Vascular/Lymphatic: Mild atherosclerotic changes of the abdominal aorta. No dissection or aneurysm. No periaortic fluid. The iliac and bilateral proximal femoral arteries are patent. Small lymph nodes in the portacaval nodal station, not significantly enlarged. Small lymph nodes in the gastrohepatic  ligament, not enlarged. No significant mesenteric adenopathy. No retroperitoneal adenopathy. No inguinal adenopathy. Reproductive: Internal prostate calcifications with transverse diameter of the prostate measuring 4.2  cm Other: No abdominal wall hernia Musculoskeletal: No acute displaced fracture. Mild degenerative changes of the spine. Mild degenerative changes of the hips. IMPRESSION: No acute CT finding. There is subtle irregularity of the liver contour without other CT evidence of cirrhosis. Gallbladder is decompressed without cholelithiasis. No evidence of biliary dilatation. Aortic Atherosclerosis (ICD10-I70.0). Electronically Signed   By: Gilmer Mor D.O.   On: 02/10/2018 14:53   Nm Hepato W/eject Fract  Result Date: 02/10/2018 CLINICAL DATA:  52 year old male with hyperbilirubinemia. Right upper quadrant abdominal pain. Gallbladder wall thickening and mild pericholecystic fluid on ultrasound today. Similar appearance on CT Abdomen and Pelvis today, although on both studies the gallbladder was relatively contracted. No gallstones identified by ultrasound. EXAM: NUCLEAR MEDICINE HEPATOBILIARY IMAGING TECHNIQUE: Sequential images of the abdomen were obtained out to 60 minutes following intravenous administration of radiopharmaceutical. RADIOPHARMACEUTICALS:  7.8 mCi Tc-66m  Choletec IV COMPARISON:  CT Abdomen and Pelvis 1434 hours today. Abdomen ultrasound 1219 hours. FINDINGS: Prompt radiotracer uptake by the liver and clearance of the blood pool. Prompt small bowel activity by 20 minutes. However, no gallbladder activity occurred over the 1st hour of the study. Vicarious contrast excretion in the urinary bladder incidentally noted. Decision was made to augment the exam with 3.5 milligrams IV morphine, and continue imaging. Post morphine imaging for an additional 30 minutes fail to demonstrate any gallbladder activity. Progressive small bowel activity occurred. IMPRESSION: 1. Nonvisualization of the gallbladder despite augmentation of the study with IV morphine and imaging for 90 total minutes. However, the contracted appearance of the gallbladder on CT and Ultrasound today would make Acute Acalculous Cholecystitis  seem unlikely. Favor Chronic Cholecystitis instead. 2. Patent common bile duct. Electronically Signed   By: Odessa Fleming M.D.   On: 02/10/2018 20:11   US Abdomen Limited Ruq  Result Date: 02/10/2018 CLINICAL DATA:  Upper abdominal pain.  Elevated liver enzymes. EXAM: ULTRASOUND ABDOMEN LIMITED RIGHT UPPER QUADRANT COMPARISON:  None. FINDINGS: Gallbladder: Gallbladder wall thickening to 4 mm with small crescent of edema around the fundus. Given negative Murphy sign, no stones, and limited distension this is likely reactive thickening rather than from cholecystitis. Common bile duct: Diameter: 5 mm Liver: Echogenic diffusely with gallbladder fossa sparing. No focal lesion is seen. Portal vein is patent on color Doppler imaging with normal direction of blood flow towards the liver. IMPRESSION: 1. Gallbladder wall thickening and mild pericholecystic edema that is likely reactive, as above. 2. Hepatic steatosis. Electronically Signed   By: Marnee Spring M.D.   On: 02/10/2018 12:40    Time Spent in minutes  30   Susa Raring M.D on 02/11/2018 at 1:31 PM  Between 7am to 7pm - Pager - [phone] ( page via [email or web address], text pages only, please mention full 10 digit call back number). After 7pm go to [email or web address] - password [email or web address]

## 2018-02-11 NOTE — Progress Notes (Signed)
Patient ID: Manuel Bates, male   DOB: 18-Sep-1966, 52 y.o.   MRN: [id number] [facility] Surgery Progress Note:   * No surgery found *  Subjective: Mental status is clear;  History is 2 weeks with abscess on left chest managed my primary care Kelly in [place].  Two weeks ago he was having night sweats and temps to 102 and was treated in the office with Bactrim initially and later two other drugs including a shot.  He presented here with jaundice.  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  The nature and cause of his fevers and abscess and abx add a wrinkle to his presentation.  He hasn't had any upper abdominal pain.   Objective: Vital signs in last 24 hours: Temp:  [99.2 F (37.3 C)-102.1 F (38.9 C)] 99.2 F (37.3 C) (03/16 0600) Pulse Rate:  [84-98] 89 (03/16 0600) Resp:  [17-20] 18 (03/16 0600) BP: (88-100)/(54-73) 94/69 (03/16 0600) SpO2:  [95 %-100 %] 96 % (03/16 0600) Weight:  [83.4 kg (183 lb 13.8 oz)-86.2 kg (190 lb)] 83.4 kg (183 lb 13.8 oz) (03/15 2041)  Intake/Output from previous day: 03/15 0701 - 03/16 0700 In: 2108.3 [I.V.:1008.3; IV Piggyback:1100] Out: 250 [Urine:250] Intake/Output this shift: No intake/output data recorded.  Physical Exam: Work of breathing is normal.  Jaundice present; no abdominal pain.  Lab Results:  Results for orders placed or performed during the hospital encounter of 02/10/18 (from the past 48 hour(s))  CBC     Status: Abnormal   Collection Time: 02/10/18 12:04 PM  Result Value Ref Range   WBC 11.4 (H) 4.0 - 10.5 K/uL   RBC 4.42 4.22 - 5.81 MIL/uL   Hemoglobin 14.7 13.0 - 17.0 g/dL   HCT 40.8 39.0 - 52.0 %   MCV 92.3 78.0 - 100.0 fL   MCH 33.3 26.0 - 34.0 pg   MCHC 36.0 30.0 - 36.0 g/dL   RDW 12.1 11.5 - 15.5 %   Platelets 276 150 - 400 K/uL    Comment: Performed at [facility], [place] [address], [place], [place] [address]  Comprehensive metabolic panel     Status: Abnormal    Collection Time: 02/10/18 12:04 PM  Result Value Ref Range   Sodium 130 (L) 135 - 145 mmol/L   Potassium 4.0 3.5 - 5.1 mmol/L   Chloride 95 (L) 101 - 111 mmol/L   CO2 26 22 - 32 mmol/L   Glucose, Bld 115 (H) 65 - 99 mg/dL   BUN 26 (H) 6 - 20 mg/dL   Creatinine, Ser 1.28 (H) 0.61 - 1.24 mg/dL   Calcium 8.6 (L) 8.9 - 10.3 mg/dL   Total Protein 7.2 6.5 - 8.1 g/dL   Albumin 3.5 3.5 - 5.0 g/dL   AST 100 (H) 15 - 41 U/L   ALT 231 (H) 17 - 63 U/L   Alkaline Phosphatase 258 (H) 38 - 126 U/L   Total Bilirubin 6.1 (H) 0.3 - 1.2 mg/dL   GFR calc non Af Amer >60 >60 mL/min   GFR calc Af Amer >60 >60 mL/min    Comment: (NOTE) The eGFR has been calculated using the CKD EPI equation. This calculation has not been validated in all clinical situations. eGFR's persistently <60 mL/min signify possible Chronic Kidney Disease.    Anion gap 9 5 - 15    Comment: Performed at [facility], [place] [address], [place], [place] [address]  Lipase, blood  Status: None   Collection Time: 02/10/18 12:04 PM  Result Value Ref Range   Lipase 26 11 - 51 U/L    Comment: Performed at [facility], [place] [address], [place], [place] [address]  I-Stat CG4 Lactic Acid, ED     Status: None   Collection Time: 02/10/18 12:12 PM  Result Value Ref Range   Lactic Acid, Venous 1.11 0.5 - 1.9 mmol/L  Urinalysis, Routine w reflex microscopic     Status: Abnormal   Collection Time: 02/10/18  1:08 PM  Result Value Ref Range   Color, Urine AMBER (A) YELLOW    Comment: BIOCHEMICALS MAY BE AFFECTED BY COLOR   APPearance CLEAR CLEAR   Specific Gravity, Urine 1.021 1.005 - 1.030   pH 6.0 5.0 - 8.0   Glucose, UA NEGATIVE NEGATIVE mg/dL   Hgb urine dipstick NEGATIVE NEGATIVE   Bilirubin Urine MODERATE (A) NEGATIVE   Ketones, ur NEGATIVE NEGATIVE mg/dL   Protein, ur 30 (A) NEGATIVE mg/dL   Nitrite NEGATIVE NEGATIVE   Leukocytes, UA NEGATIVE NEGATIVE   RBC / HPF 0-5 0 - 5 RBC/hpf   WBC, UA  6-30 0 - 5 WBC/hpf   Bacteria, UA NONE SEEN NONE SEEN   Squamous Epithelial / LPF 0-5 (A) NONE SEEN   Mucus PRESENT    Hyaline Casts, UA PRESENT     Comment: Performed at [facility], [place] [address] [place], [place] [address]  Wound or Superficial Culture     Status: None (Preliminary result)   Collection Time: 02/10/18  4:01 PM  Result Value Ref Range   Specimen Description      ABSCESS LEFT BREAST Performed at [place] [address], [place], [place] [address]    Special Requests      NONE Performed at [facility], [place] [address] [place], [place] [address]    Gram Stain      FEW WBC PRESENT,BOTH PMN AND MONONUCLEAR MODERATE GRAM POSITIVE COCCI IN PAIRS Performed at [facility], [place] [address], [place], [place] [address]    Culture PENDING    Report Status PENDING   Blood culture (routine x 2)     Status: None (Preliminary result)   Collection Time: 02/10/18  4:47 PM  Result Value Ref Range   Specimen Description      BLOOD LEFT HAND Performed at [facility], [place] [address], [place], [place] [address]    Special Requests      BOTTLES DRAWN AEROBIC AND ANAEROBIC Blood Culture adequate volume Performed at [place] [address], [place], [place] [address]    Culture PENDING    Report Status PENDING   I-Stat CG4 Lactic Acid, ED     Status: None   Collection Time: 02/10/18  5:01 PM  Result Value Ref Range   Lactic Acid, Venous 0.97 0.5 - 1.9 mmol/L  Comprehensive metabolic panel     Status: Abnormal   Collection Time: 02/11/18  5:02 AM  Result Value Ref Range   Sodium 135 135 - 145 mmol/L   Potassium 3.9 3.5 - 5.1 mmol/L   Chloride 101 101 - 111 mmol/L   CO2 24 22 - 32 mmol/L   Glucose, Bld 102 (H) 65 - 99 mg/dL   BUN 22 (H) 6 - 20 mg/dL   Creatinine, Ser 1.09 0.61 - 1.24 mg/dL   Calcium 8.0 (L) 8.9 - 10.3 mg/dL   Total Protein 6.0 (  L) 6.5 - 8.1 g/dL   Albumin  2.7 (L) 3.5 - 5.0 g/dL   AST 91 (H) 15 - 41 U/L   ALT 192 (H) 17 - 63 U/L   Alkaline Phosphatase 222 (H) 38 - 126 U/L   Total Bilirubin 5.9 (H) 0.3 - 1.2 mg/dL   GFR calc non Af Amer >60 >60 mL/min   GFR calc Af Amer >60 >60 mL/min    Comment: (NOTE) The eGFR has been calculated using the CKD EPI equation. This calculation has not been validated in all clinical situations. eGFR's persistently <60 mL/min signify possible Chronic Kidney Disease.    Anion gap 10 5 - 15    Comment: Performed at [facility], [place] [address], [place], [place] [address]    Radiology/Results: Dg Chest 2 View  Result Date: 02/10/2018 CLINICAL DATA:  Fever and malaise EXAM: CHEST - 2 VIEW COMPARISON:  None. FINDINGS: The heart size and mediastinal contours are within normal limits. Both lungs are clear. Mild scarring in the lingula. The visualized skeletal structures are unremarkable. IMPRESSION: No active cardiopulmonary disease. Electronically Signed   By: Franchot Gallo M.D.   On: 02/10/2018 12:48   Ct Abdomen Pelvis W Contrast  Result Date: 02/10/2018 CLINICAL DATA:  52 year old male with a history of elevated LFTs and scleral icterus EXAM: CT ABDOMEN AND PELVIS WITH CONTRAST TECHNIQUE: Multidetector CT imaging of the abdomen and pelvis was performed using the standard protocol following bolus administration of intravenous contrast. CONTRAST:  161m ISOVUE-300 IOPAMIDOL (ISOVUE-300) INJECTION 61% COMPARISON:  Ultrasound 02/10/2018 FINDINGS: Lower chest: Respiratory motion somewhat limits evaluation of the lungs. Minimal atelectasis/scarring at the dependent aspect of the lung bases. Hepatobiliary: Cranial caudal span of the right liver measures 19.3 cm. Subtle irregularity on the surface of the liver. No focal liver lesion. No periportal edema. Gallbladder relatively decompressed, otherwise unremarkable. Pancreas: Unremarkable pancreas Spleen: Unremarkable spleen Adrenals/Urinary Tract:  Unremarkable adrenal glands. Unremarkable right kidney with no hydronephrosis or nephrolithiasis. Unremarkable course of the right ureter. Unremarkable left kidney with no hydronephrosis or nephrolithiasis. Unremarkable course of the left ureter. Unremarkable urinary bladder Stomach/Bowel: Unremarkable stomach. Unremarkable small bowel without distention. No focal wall thickening. No inflammatory changes of the mesentery. Normal appendix. No abnormally distended colon. No inflammatory changes. No focal colonic wall thickening Vascular/Lymphatic: Mild atherosclerotic changes of the abdominal aorta. No dissection or aneurysm. No periaortic fluid. The iliac and bilateral proximal femoral arteries are patent. Small lymph nodes in the portacaval nodal station, not significantly enlarged. Small lymph nodes in the gastrohepatic ligament, not enlarged. No significant mesenteric adenopathy. No retroperitoneal adenopathy. No inguinal adenopathy. Reproductive: Internal prostate calcifications with transverse diameter of the prostate measuring 4.2 cm Other: No abdominal wall hernia Musculoskeletal: No acute displaced fracture. Mild degenerative changes of the spine. Mild degenerative changes of the hips. IMPRESSION: No acute CT finding. There is subtle irregularity of the liver contour without other CT evidence of cirrhosis. Gallbladder is decompressed without cholelithiasis. No evidence of biliary dilatation. Aortic Atherosclerosis (ICD10-I70.0). Electronically Signed   By: JCorrie MckusickD.O.   On: 02/10/2018 14:53   Nm Hepato W/eject Fract  Result Date: 02/10/2018 CLINICAL DATA:  52year old male with hyperbilirubinemia. Right upper quadrant abdominal pain. Gallbladder wall thickening and mild pericholecystic fluid on ultrasound today. Similar appearance on CT Abdomen and Pelvis today, although on both studies the gallbladder was relatively contracted. No gallstones identified by ultrasound. EXAM: NUCLEAR MEDICINE  HEPATOBILIARY IMAGING TECHNIQUE: Sequential images of the abdomen were obtained out to  60 minutes following intravenous administration of radiopharmaceutical. RADIOPHARMACEUTICALS:  7.8 mCi Tc-92m Choletec IV COMPARISON:  CT Abdomen and Pelvis 1434 hours today. Abdomen ultrasound 1219 hours. FINDINGS: Prompt radiotracer uptake by the liver and clearance of the blood pool. Prompt small bowel activity by 20 minutes. However, no gallbladder activity occurred over the 1st hour of the study. Vicarious contrast excretion in the urinary bladder incidentally noted. Decision was made to augment the exam with 3.5 milligrams IV morphine, and continue imaging. Post morphine imaging for an additional 30 minutes fail to demonstrate any gallbladder activity. Progressive small bowel activity occurred. IMPRESSION: 1. Nonvisualization of the gallbladder despite augmentation of the study with IV morphine and imaging for 90 total minutes. However, the contracted appearance of the gallbladder on CT and Ultrasound today would make Acute Acalculous Cholecystitis seem unlikely. Favor Chronic Cholecystitis instead. 2. Patent common bile duct. Electronically Signed   By: HGenevie AnnM.D.   On: 02/10/2018 20:11   UKoreaAbdomen Limited Ruq  Result Date: 02/10/2018 CLINICAL DATA:  Upper abdominal pain.  Elevated liver enzymes. EXAM: ULTRASOUND ABDOMEN LIMITED RIGHT UPPER QUADRANT COMPARISON:  None. FINDINGS: Gallbladder: Gallbladder wall thickening to 4 mm with small crescent of edema around the fundus. Given negative Murphy sign, no stones, and limited distension this is likely reactive thickening rather than from cholecystitis. Common bile duct: Diameter: 5 mm Liver: Echogenic diffusely with gallbladder fossa sparing. No focal lesion is seen. Portal vein is patent on color Doppler imaging with normal direction of blood flow towards the liver. IMPRESSION: 1. Gallbladder wall thickening and mild pericholecystic edema that is likely reactive, as  above. 2. Hepatic steatosis. Electronically Signed   By: JMonte FantasiaM.D.   On: 02/10/2018 12:40    Anti-infectives: Anti-infectives (From admission, onward)   Start     Dose/Rate Route Frequency Ordered Stop   02/11/18 0000  piperacillin-tazobactam (ZOSYN) IVPB 3.375 g     3.375 g 12.5 mL/hr over 240 Minutes Intravenous Every 8 hours 02/10/18 2352     02/10/18 1600  piperacillin-tazobactam (ZOSYN) IVPB 3.375 g     3.375 g 100 mL/hr over 30 Minutes Intravenous  Once 02/10/18 1555 02/10/18 1640      Assessment/Plan: Problem List: Patient Active Problem List   Diagnosis Date Noted  . Acute cholecystitis 02/10/2018    I have requested ID to see (Dr. VTommy Medal as this could be drug induced jaundice or a more remote infection related to his work exposure.   * No surgery found *    LOS: 1 day   Matt B. MHassell Done MD, FNorth [facility], [facility] [phone] [phone] 02/11/2018 9:59 AM

## 2018-02-11 NOTE — Progress Notes (Signed)
Pharmacy Antibiotic Note  Reed PandyRobert Kenneth Koch is a 52 y.o. male admitted on 02/10/2018 with persistent abdominal pain, fever, elevated liver enzymes, and an infected cyst of the left breast. Pharmacy has been consulted for Levaquin and doxycycline dosing as recommended by ID to cover for intra-abdominal infection and possibility of MRSA in the wound. Received a dose of each already today. SCr improved to wnl, CrCl ~2280ml/min.  Plan: Levaquin 750mg  PO daily. Doxycycline 100mg  PO twice daily. Pharmacy will follow renal function, culture results, and clinical progress.  Height: 5\' 9"  (175.3 cm) Weight: 183 lb 13.8 oz (83.4 kg) IBW/kg (Calculated) : 70.7  Temp (24hrs), Avg:100.4 F (38 C), Min:99.2 F (37.3 C), Max:102.1 F (38.9 C)  Recent Labs  Lab 02/10/18 1204 02/10/18 1212 02/10/18 1701 02/11/18 0502  WBC 11.4*  --   --   --   CREATININE 1.28*  --   --  1.09  LATICACIDVEN  --  1.11 0.97  --     Estimated Creatinine Clearance: 79.3 mL/min (by C-G formula based on SCr of 1.09 mg/dL).    Allergies  Allergen Reactions  . Statins Anxiety    Antimicrobials this admission: 3/15 Zosyn >> 3/16 3/15 Levaquin >>  3/15 Doxy >>   Dose adjustments this admission:   Microbiology results: 3/15 BCx: sent 3/15 UCx: sent  3/15 L breast wound/abscess: GPC in pairs  Thank you for allowing pharmacy to be a part of this patient's care.  Charolotte Ekeom Mystery Schrupp, PharmD. Mobile: [phone]. 02/11/2018,1:44 PM.

## 2018-02-12 DIAGNOSIS — T368X5A Adverse effect of other systemic antibiotics, initial encounter: Secondary | ICD-10-CM

## 2018-02-12 DIAGNOSIS — L089 Local infection of the skin and subcutaneous tissue, unspecified: Secondary | ICD-10-CM

## 2018-02-12 DIAGNOSIS — R1010 Upper abdominal pain, unspecified: Secondary | ICD-10-CM

## 2018-02-12 DIAGNOSIS — R748 Abnormal levels of other serum enzymes: Secondary | ICD-10-CM

## 2018-02-12 DIAGNOSIS — L729 Follicular cyst of the skin and subcutaneous tissue, unspecified: Secondary | ICD-10-CM

## 2018-02-12 DIAGNOSIS — K710 Toxic liver disease with cholestasis: Secondary | ICD-10-CM

## 2018-02-12 LAB — COMPREHENSIVE METABOLIC PANEL
ALT: 195 U/L — ABNORMAL HIGH (ref 17–63)
ANION GAP: 7 (ref 5–15)
AST: 111 U/L — AB (ref 15–41)
Albumin: 2.4 g/dL — ABNORMAL LOW (ref 3.5–5.0)
Alkaline Phosphatase: 208 U/L — ABNORMAL HIGH (ref 38–126)
BILIRUBIN TOTAL: 4.2 mg/dL — AB (ref 0.3–1.2)
BUN: 16 mg/dL (ref 6–20)
CHLORIDE: 103 mmol/L (ref 101–111)
CO2: 27 mmol/L (ref 22–32)
Calcium: 8 mg/dL — ABNORMAL LOW (ref 8.9–10.3)
Creatinine, Ser: 0.88 mg/dL (ref 0.61–1.24)
GFR calc Af Amer: >60 mL/min (ref >60)
GFR calc non Af Amer: >60 mL/min (ref >60)
GLUCOSE: 100 mg/dL — AB (ref 65–99)
POTASSIUM: 3.9 mmol/L (ref 3.5–5.1)
SODIUM: 137 mmol/L (ref 135–145)
TOTAL PROTEIN: 5.8 g/dL — AB (ref 6.5–8.1)

## 2018-02-12 LAB — CBC
HCT: 36 % — ABNORMAL LOW (ref 39.0–52.0)
Hemoglobin: 12.1 g/dL — ABNORMAL LOW (ref 13.0–17.0)
MCH: 32.3 pg (ref 26.0–34.0)
MCHC: 33.6 g/dL (ref 30.0–36.0)
MCV: 96 fL (ref 78.0–100.0)
PLATELETS: 312 10*3/uL (ref 150–400)
RBC: 3.75 MIL/uL — ABNORMAL LOW (ref 4.22–5.81)
RDW: 12.5 % (ref 11.5–15.5)
WBC: 8.6 10*3/uL (ref 4.0–10.5)

## 2018-02-12 LAB — URINE CULTURE: Culture: NO GROWTH

## 2018-02-12 LAB — HEPATITIS PANEL, ACUTE
HEP A IGM: NEGATIVE
HEP B C IGM: NEGATIVE
HEP B S AG: NEGATIVE

## 2018-02-12 NOTE — Progress Notes (Signed)
HISTORY OF PRESENT ILLNESS:  Manuel Bates is a 52 y.o. male admitted with transient upper abdominal pain and jaundice. Head fevers. Seen by surgery and ID as well as GI. The consensus is drug-induced hepatitis. Bactrim. Ambulating in the hall with wife. No complaints. Liver tests trending down slowly.  REVIEW OF SYSTEMS:  All non-GI ROS negative   Past Medical History:  Diagnosis Date  . Hypertension     Past Surgical History:  Procedure Laterality Date  . OTHER SURGICAL HISTORY     sinus surgery    Social History Manuel PandyRobert Kenneth Vaillancourt  reports that he has been smoking cigarettes.  He has a 55.50 pack-year smoking history. he has never used smokeless tobacco. He reports that he drinks alcohol. He reports that he does not use drugs.  family history is not on file.  Allergies  Allergen Reactions  . Statins Anxiety       PHYSICAL EXAMINATION: Vital signs: BP 99/76 (BP Location: Right Arm)   Pulse 79   Temp 98.4 F (36.9 C) (Oral)   Resp 16   Ht 5\' 9"  (1.753 m)   Wt 183 lb 13.8 oz (83.4 kg)   SpO2 99%   BMI 27.15 kg/m  General: Well-developed, well-nourished, no acute distress. Jaundiced HEENT: Sclerae are icteric, conjunctiva pink. Oral mucosa intact Abdomen:not reexamined Psychiatric: alert and oriented x3. Cooperative  ASSESSMENT:  1. Probable drug-induced hepatitis   PLAN:  1. Continue to monitor liver tests. Anticipate that he should be able to go home tomorrow. Dr. [name] will resume his GI care. I have shared his weekend progress information with Dr. [name].  Wilhemina BonitoJohn N. Eda KeysPerry, Jr., M.D. [facility] of Gastroenterology

## 2018-02-12 NOTE — Progress Notes (Signed)
Subjective: No new complaints   Antibiotics:  Anti-infectives (From admission, onward)   Start     Dose/Rate Route Frequency Ordered Stop   02/12/18 1000  levofloxacin (LEVAQUIN) tablet 750 mg     750 mg Oral Daily 02/11/18 1349     02/11/18 2000  doxycycline (VIBRA-TABS) tablet 100 mg     100 mg Oral Every 12 hours 02/11/18 1349     02/11/18 1400  levofloxacin (LEVAQUIN) tablet 750 mg  Status:  Discontinued     750 mg Oral Daily 02/11/18 1219 02/11/18 1330   02/11/18 1400  doxycycline (VIBRA-TABS) tablet 100 mg  Status:  Discontinued     100 mg Oral Every 12 hours 02/11/18 1219 02/11/18 1330   02/11/18 1345  piperacillin-tazobactam (ZOSYN) IVPB 3.375 g  Status:  Discontinued     3.375 g 100 mL/hr over 30 Minutes Intravenous Every 6 hours 02/11/18 1330 02/11/18 1331   02/11/18 0000  piperacillin-tazobactam (ZOSYN) IVPB 3.375 g  Status:  Discontinued     3.375 g 12.5 mL/hr over 240 Minutes Intravenous Every 8 hours 02/10/18 2352 02/11/18 1217   02/10/18 1600  piperacillin-tazobactam (ZOSYN) IVPB 3.375 g     3.375 g 100 mL/hr over 30 Minutes Intravenous  Once 02/10/18 1555 02/10/18 1640      Medications: Scheduled Meds: . doxycycline  100 mg Oral Q12H  . enoxaparin (LOVENOX) injection  40 mg Subcutaneous Q24H  . levofloxacin  750 mg Oral Daily  . nicotine  21 mg Transdermal Daily   Continuous Infusions: . sodium chloride 75 mL/hr (02/12/18 0700)   PRN Meds:.acetaminophen **OR** [DISCONTINUED] acetaminophen, ondansetron **OR** ondansetron (ZOFRAN) IV    Objective: Weight change:   Intake/Output Summary (Last 24 hours) at 02/12/2018 1411 Last data filed at 02/12/2018 1034 Gross per 24 hour  Intake 2220 ml  Output 2350 ml  Net -130 ml   Blood pressure 112/68, pulse 79, temperature 98.4 F (36.9 C), temperature source Oral, resp. rate 16, height 5\' 9"  (1.753 m), weight 183 lb 13.8 oz (83.4 kg), SpO2 100 %. Temp:  [98.3 F (36.8 C)-98.7 F (37.1 C)] 98.4 F  (36.9 C) (03/17 0542) Pulse Rate:  [79-86] 79 (03/17 0542) Resp:  [16-17] 16 (03/17 0542) BP: (90-112)/(62-68) 112/68 (03/17 0542) SpO2:  [98 %-100 %] 100 % (03/17 0542)  Physical Exam: General: Alert and awake, oriented x3, not in any acute distress. HEENT: icteric sclera,  EOMI CVS regular rate, normal r,  no murmur rubs or gallops Chest: clear to auscultation bilaterally, no wheezing, rales or rhonchi Abdomen: soft nontender, nondistended, normal bowel sounds, Extremities: no  clubbing or edema noted bilaterally Skin:  Wound is draining 02/12/18:      Neuro nonfocal CBC:  CBC Latest Ref Rng & Units 02/12/2018 02/10/2018  WBC 4.0 - 10.5 K/uL 8.6 11.4(H)  Hemoglobin 13.0 - 17.0 g/dL 12.1(L) 14.7  Hematocrit 39.0 - 52.0 % 36.0(L) 40.8  Platelets 150 - 400 K/uL 312 276      BMET Recent Labs    02/11/18 0502 02/12/18 0431  NA 135 137  K 3.9 3.9  CL 101 103  CO2 24 27  GLUCOSE 102* 100*  BUN 22* 16  CREATININE 1.09 0.88  CALCIUM 8.0* 8.0*     Liver Panel  Recent Labs    02/11/18 0502 02/12/18 0431  PROT 6.0* 5.8*  ALBUMIN 2.7* 2.4*  AST 91* 111*  ALT 192* 195*  ALKPHOS 222* 208*  BILITOT 5.9* 4.2*  Sedimentation Rate No results for input(s): ESRSEDRATE in the last 72 hours. C-Reactive Protein No results for input(s): CRP in the last 72 hours.  Micro Results: Recent Results (from the past 720 hour(s))  Urine culture     Status: None   Collection Time: 02/10/18  1:08 PM  Result Value Ref Range Status   Specimen Description   Final    URINE, CLEAN CATCH Performed at [facility], [address] [place], [place] [address]    Special Requests   Final    NONE Performed at [facility], [address] [place], [place] [address]    Culture   Final    NO GROWTH Performed at [facility], [address] [place], [place] [address]    Report Status 02/12/2018 FINAL  Final  Wound or Superficial  Culture     Status: None (Preliminary result)   Collection Time: 02/10/18  4:01 PM  Result Value Ref Range Status   Specimen Description   Final    ABSCESS LEFT BREAST Performed at [facility], [address] [place], [place] [address]    Special Requests   Final    NONE Performed at [facility], [address] [place], [place] [address]    Gram Stain   Final    FEW WBC PRESENT,BOTH PMN AND MONONUCLEAR MODERATE GRAM POSITIVE COCCI IN PAIRS    Culture   Final    CULTURE REINCUBATED FOR BETTER GROWTH Performed at [facility], [address] [place], [place] [address]    Report Status PENDING  Incomplete  Blood culture (routine x 2)     Status: None (Preliminary result)   Collection Time: 02/10/18  4:47 PM  Result Value Ref Range Status   Specimen Description   Final    BLOOD LEFT ANTECUBITAL Performed at [facility], [address], [place], [place] [address]    Special Requests   Final    BOTTLES DRAWN AEROBIC AND ANAEROBIC Blood Culture adequate volume Performed at [facility], [address] [place], [place] [address]    Culture   Final    NO GROWTH < 24 HOURS Performed at [facility], [address] [place], [place] [address]    Report Status PENDING  Incomplete  Blood culture (routine x 2)     Status: None (Preliminary result)   Collection Time: 02/10/18  4:47 PM  Result Value Ref Range Status   Specimen Description   Final    BLOOD LEFT HAND Performed at [facility], [address], [place], [place] [address]    Special Requests   Final    BOTTLES DRAWN AEROBIC AND ANAEROBIC Blood Culture adequate volume Performed at [facility], [address] [place], [place] [address]    Culture   Final    NO GROWTH < 24 HOURS Performed at [facility], [address] [place], [place] [address]    Report Status PENDING  Incomplete     Studies/Results: Ct Abdomen Pelvis W Contrast  Result Date: 02/10/2018 CLINICAL DATA:  52 year old male with a history of elevated LFTs and scleral icterus EXAM: CT ABDOMEN AND PELVIS WITH CONTRAST TECHNIQUE: Multidetector CT imaging of the abdomen and pelvis was performed using the standard protocol following bolus administration of intravenous contrast. CONTRAST:  ISOVUE-300 IOPAMIDOL (ISOVUE-300) INJECTION 61% COMPARISON:  Ultrasound 02/10/2018 FINDINGS: Lower chest: Respiratory motion somewhat  limits evaluation of the lungs. Minimal atelectasis/scarring at the dependent aspect of the lung bases. Hepatobiliary: Cranial caudal span of the right liver measures 19.3 cm. Subtle irregularity on the surface of the liver. No focal liver lesion. No periportal edema. Gallbladder relatively decompressed, otherwise unremarkable. Pancreas: Unremarkable pancreas Spleen: Unremarkable spleen Adrenals/Urinary Tract: Unremarkable adrenal glands. Unremarkable right kidney with no hydronephrosis or nephrolithiasis. Unremarkable course of the right ureter. Unremarkable left kidney with no hydronephrosis or nephrolithiasis. Unremarkable course of the left ureter. Unremarkable urinary bladder Stomach/Bowel: Unremarkable stomach. Unremarkable small bowel without distention. No focal wall thickening. No inflammatory changes of the mesentery. Normal appendix. No abnormally distended colon. No inflammatory changes. No focal colonic wall thickening Vascular/Lymphatic: Mild atherosclerotic changes of the abdominal aorta. No dissection or aneurysm. No periaortic fluid. The iliac and bilateral proximal femoral arteries are patent. Small lymph nodes in the portacaval nodal station, not significantly enlarged. Small lymph nodes in the gastrohepatic ligament, not enlarged. No significant mesenteric adenopathy. No retroperitoneal adenopathy. No inguinal adenopathy. Reproductive: Internal prostate calcifications with transverse  diameter of the prostate measuring 4.2 cm Other: No abdominal wall hernia Musculoskeletal: No acute displaced fracture. Mild degenerative changes of the spine. Mild degenerative changes of the hips. IMPRESSION: No acute CT finding. There is subtle irregularity of the liver contour without other CT evidence of cirrhosis. Gallbladder is decompressed without cholelithiasis. No evidence of biliary dilatation. Aortic Atherosclerosis (ICD10-I70.0). Electronically Signed   By: Gilmer Mor D.O.   On: 02/10/2018 14:53   Nm Hepato W/eject Fract  Result Date: 02/10/2018 CLINICAL DATA:  52 year old male with hyperbilirubinemia. Right upper quadrant abdominal pain. Gallbladder wall thickening and mild pericholecystic fluid on ultrasound today. Similar appearance on CT Abdomen and Pelvis today, although on both studies the gallbladder was relatively contracted. No gallstones identified by ultrasound. EXAM: NUCLEAR MEDICINE HEPATOBILIARY IMAGING TECHNIQUE: Sequential images of the abdomen were obtained out to 60 minutes following intravenous administration of radiopharmaceutical. RADIOPHARMACEUTICALS:  7.8 mCi Tc-76m  Choletec IV COMPARISON:  CT Abdomen and Pelvis 1434 hours today. Abdomen ultrasound 1219 hours. FINDINGS: Prompt radiotracer uptake by the liver and clearance of the blood pool. Prompt small bowel activity by 20 minutes. However, no gallbladder activity occurred over the 1st hour of the study. Vicarious contrast excretion in the urinary bladder incidentally noted. Decision was made to augment the exam with 3.5 milligrams IV morphine, and continue imaging. Post morphine imaging for an additional 30 minutes fail to demonstrate any gallbladder activity. Progressive small bowel activity occurred. IMPRESSION: 1. Nonvisualization of the gallbladder despite augmentation of the study with IV morphine and imaging for 90 total minutes. However, the contracted appearance of the gallbladder on CT and Ultrasound today would  make Acute Acalculous Cholecystitis seem unlikely. Favor Chronic Cholecystitis instead. 2. Patent common bile duct. Electronically Signed   By: Odessa Fleming M.D.   On: 02/10/2018 20:11      Assessment/Plan:  INTERVAL HISTORY: LFts getting better   Active Problems:   Acute cholecystitis   Elevated liver enzymes   Upper abdominal pain   Drug-induced liver injury    Manuel Bates is a 52 y.o. male who developed infection of cyst in breast tissue, then after being given po bactrim, azithromycin and IM PCN appears to have developed a drug induced cholestatic liver injury  #1 Cholestatic drug injury: continue supportive care. Would avoid macrolides and sulfa antibiotics in future and observe cautiously when giving a  PCN  #2 Infected cyst: would continue doxy for MRSA coverage and  levaquin will provide good strep coverage x 7 more days.  I will sign off  Please call with further questions.   LOS: 2 days   Acey Lav 02/12/2018, 2:11 PM

## 2018-02-12 NOTE — Progress Notes (Signed)
Patient ID: Manuel Bates, male   DOB: Mar 14, 1966, 52 y.o.   MRN: [id number] Eating lunch. ID & GI consults appreciated  Drug induced icterus  CCS will sign off.    Wenda LowMatt Areesha Dehaven, MD, FACS

## 2018-02-12 NOTE — Progress Notes (Signed)
@IPLOG @        PROGRESS NOTE                                                                                                                                                                                                             Patient Demographics:    Manuel Bates, is a 52 y.o. male, DOB - 1966/06/01, HYQ:[id number]  Admit date - 02/10/2018   Admitting Physician Kathlen Mody, MD  Outpatient Primary MD for the patient is [name], MD  LOS - 2  Chief Complaint  Patient presents with  . sent for CT scan  . elevated liver enzymes       Brief Narrative   Manuel Bates is a 53 y.o. male with medical history significant of hypertension, left chest wall abscess/ infected cyst on oral antibiotics at home, comes in for 2 to 3 weeks of persistent fevers, chills,  abdominal pain, associated with nausea, vomiting . Pain in the RUQ , workup revealed elevated liver enzymes along with bilirubin with HIDA scan being equivocal.   Subjective:   Patient in bed, appears comfortable, denies any headache, no fever, no chest pain or pressure, no shortness of breath , no abdominal pain. No focal weakness.   Assessment  & Plan :     1.  Possible chronic cholecystitis.  General surgery and GI on board, CT scan unrevealing but on ultrasound there was edema around the gallbladder although it did not show any gallstones or CBD stones, HIDA scan was equivocal more consistent with chronic cholecystitis.  He has been treated conservatively with bowel rest, empiric Levaquin, his abdominal exam was completely unremarkable, seen by GI general surgery and ID.  It certainly seems that his elevated LFTs could have been due to oral antibiotic exposure which he was taking prior to the hospitalization for his left chest wall/breast infection and abscess.  That his fever could have been due to the abscess.  His acute hepatitis and HIV serology are negative.  Will place him on liquid diet and monitor.  If  stable likely discharge in the morning.   2.  History of alcohol binging during weekends.  Counseled to quit.  Does not drink on a daily basis will monitor for any signs of withdrawals.  3.  Recent left breast/chest wall abscess/cyst infection.  Currently on doxycycline per ID.  Need outpatient follow-up at breast clinic.  Surrounding cellulitis.     Diet : Diet full liquid Room service appropriate? Yes; Fluid consistency: Thin  Family Communication  :  None  Code Status :  Full  Disposition Plan  :  TBD  Consults  :  GI, CCS  Procedures  :      DVT Prophylaxis  :  Lovenox   Lab Results  Component Value Date   PLT 312 02/12/2018    Inpatient Medications  Scheduled Meds: . doxycycline  100 mg Oral Q12H  . enoxaparin (LOVENOX) injection  40 mg Subcutaneous Q24H  . levofloxacin  750 mg Oral Daily  . nicotine  21 mg Transdermal Daily   Continuous Infusions: . sodium chloride 75 mL/hr (02/12/18 0700)   PRN Meds:.acetaminophen **OR** [DISCONTINUED] acetaminophen, ondansetron **OR** ondansetron (ZOFRAN) IV  Antibiotics  :    Anti-infectives (From admission, onward)   Start     Dose/Rate Route Frequency Ordered Stop   02/12/18 1000  levofloxacin (LEVAQUIN) tablet 750 mg     750 mg Oral Daily 02/11/18 1349     02/11/18 2000  doxycycline (VIBRA-TABS) tablet 100 mg     100 mg Oral Every 12 hours 02/11/18 1349     02/11/18 1400  levofloxacin (LEVAQUIN) tablet 750 mg  Status:  Discontinued     750 mg Oral Daily 02/11/18 1219 02/11/18 1330   02/11/18 1400  doxycycline (VIBRA-TABS) tablet 100 mg  Status:  Discontinued     100 mg Oral Every 12 hours 02/11/18 1219 02/11/18 1330   02/11/18 1345  piperacillin-tazobactam (ZOSYN) IVPB 3.375 g  Status:  Discontinued     3.375 g 100 mL/hr over 30 Minutes Intravenous Every 6 hours 02/11/18 1330 02/11/18 1331   02/11/18 0000  piperacillin-tazobactam (ZOSYN) IVPB 3.375 g  Status:  Discontinued     3.375 g 12.5 mL/hr over 240  Minutes Intravenous Every 8 hours 02/10/18 2352 02/11/18 1217   02/10/18 1600  piperacillin-tazobactam (ZOSYN) IVPB 3.375 g     3.375 g 100 mL/hr over 30 Minutes Intravenous  Once 02/10/18 1555 02/10/18 1640         Objective:   Vitals:   02/11/18 0600 02/11/18 1425 02/11/18 2140 02/12/18 0542  BP: 94/69 97/65 90/62  112/68  Pulse: 89 86 80 79  Resp: 18 16 17 16   Temp: 99.2 F (37.3 C) 98.7 F (37.1 C) 98.3 F (36.8 C) 98.4 F (36.9 C)  TempSrc: Oral Oral Oral Oral  SpO2: 96% 98% 98% 100%  Weight:      Height:        Wt Readings from Last 3 Encounters:  02/10/18 83.4 kg (183 lb 13.8 oz)     Intake/Output Summary (Last 24 hours) at 02/12/2018 1004 Last data filed at 02/12/2018 0600 Gross per 24 hour  Intake 1980 ml  Output 1750 ml  Net 230 ml     Physical Exam  Awake Alert, Oriented X 3, No new F.N deficits, Normal affect Adin.AT,PERRAL Supple Neck,No JVD, No cervical lymphadenopathy appriciated.  Symmetrical Chest wall movement, Good air movement bilaterally, CTAB, left breast abscess site has been drained, bandage in place, mild serosanguineous discharge RRR,No Gallops, Rubs or new Murmurs, No Parasternal Heave +ve B.Sounds, Abd Soft, No tenderness, No organomegaly appriciated, No rebound - guarding or rigidity. No Cyanosis, Clubbing or edema, No new Rash or bruise     Data Review:    CBC Recent Labs  Lab 02/10/18 1204 02/12/18 0431  WBC 11.4* 8.6  HGB 14.7 12.1*  HCT 40.8 36.0*  PLT 276 312  MCV 92.3 96.0  MCH 33.3 32.3  MCHC 36.0 33.6  RDW  12.1 12.5    Chemistries  Recent Labs  Lab 02/10/18 1204 02/11/18 0502 02/12/18 0431  NA 130* 135 137  K 4.0 3.9 3.9  CL 95* 101 103  CO2 26 24 27   GLUCOSE 115* 102* 100*  BUN 26* 22* 16  CREATININE 1.28* 1.09 0.88  CALCIUM 8.6* 8.0* 8.0*  AST 100* 91* 111*  ALT 231* 192* 195*  ALKPHOS 258* 222* 208*  BILITOT 6.1* 5.9* 4.2*    ------------------------------------------------------------------------------------------------------------------ No results for input(s): CHOL, HDL, LDLCALC, TRIG, CHOLHDL, LDLDIRECT in the last 72 hours.  No results found for: HGBA1C ------------------------------------------------------------------------------------------------------------------ No results for input(s): TSH, T4TOTAL, T3FREE, THYROIDAB in the last 72 hours.  Invalid input(s): FREET3 ------------------------------------------------------------------------------------------------------------------ No results for input(s): VITAMINB12, FOLATE, FERRITIN, TIBC, IRON, RETICCTPCT in the last 72 hours.  Coagulation profile Recent Labs  Lab 02/11/18 0930  INR 1.18    No results for input(s): DDIMER in the last 72 hours.  Cardiac Enzymes No results for input(s): CKMB, TROPONINI, MYOGLOBIN in the last 168 hours.  Invalid input(s): CK ------------------------------------------------------------------------------------------------------------------ No results found for: BNP  Micro Results Recent Results (from the past 240 hour(s))  Urine culture     Status: None   Collection Time: 02/10/18  1:08 PM  Result Value Ref Range Status   Specimen Description   Final    URINE, CLEAN CATCH Performed at [facility], [address], [place], [place] [address]    Special Requests   Final    NONE Performed at [facility], [address], [place], [place] [address]    Culture   Final    NO GROWTH Performed at [facility], [address], [place], [place] [address]    Report Status 02/12/2018 FINAL  Final  Wound or Superficial Culture     Status: None (Preliminary result)   Collection Time: 02/10/18  4:01 PM  Result Value Ref Range Status   Specimen Description   Final    ABSCESS LEFT BREAST Performed at [facility], [address] [place], [place]  [address]    Special Requests   Final    NONE Performed at [facility], [address], [place], [place] [address]    Gram Stain   Final    FEW WBC PRESENT,BOTH PMN AND MONONUCLEAR MODERATE GRAM POSITIVE COCCI IN PAIRS    Culture   Final    NO GROWTH 1 DAY Performed at [facility], [address], [place], [place] [address]    Report Status PENDING  Incomplete  Blood culture (routine x 2)     Status: None (Preliminary result)   Collection Time: 02/10/18  4:47 PM  Result Value Ref Range Status   Specimen Description   Final    BLOOD LEFT ANTECUBITAL Performed at [facility], [address], [place], [place] [address]    Special Requests   Final    BOTTLES DRAWN AEROBIC AND ANAEROBIC Blood Culture adequate volume Performed at [facility], [address], [place], [place] [address]    Culture   Final    NO GROWTH < 24 HOURS Performed at [facility], [address], [place], [place] [address]    Report Status PENDING  Incomplete  Blood culture (routine x 2)     Status: None (Preliminary result)   Collection Time: 02/10/18  4:47 PM  Result Value Ref Range Status   Specimen Description   Final  BLOOD LEFT HAND Performed at [facility], [address] [place], [place] [address]    Special Requests   Final    BOTTLES DRAWN AEROBIC AND ANAEROBIC Blood Culture adequate volume Performed at [facility], [address] [place], [place] [address]    Culture   Final    NO GROWTH < 24 HOURS Performed at [facility], [address] [place], [place] [address]    Report Status PENDING  Incomplete    Radiology Reports Dg Chest 2 View  Result Date: 02/10/2018 CLINICAL DATA:  Fever and malaise EXAM: CHEST - 2 VIEW COMPARISON:  None. FINDINGS: The heart size and mediastinal contours are within normal limits. Both lungs are clear. Mild scarring in the lingula. The visualized  skeletal structures are unremarkable. IMPRESSION: No active cardiopulmonary disease. Electronically Signed   By: Marlan Palauharles  Clark M.D.   On: 02/10/2018 12:48   Ct Abdomen Pelvis W Contrast  Result Date: 02/10/2018 CLINICAL DATA:  52 year old male with a history of elevated LFTs and scleral icterus EXAM: CT ABDOMEN AND PELVIS WITH CONTRAST TECHNIQUE: Multidetector CT imaging of the abdomen and pelvis was performed using the standard protocol following bolus administration of intravenous contrast. CONTRAST:  100mL ISOVUE-300 IOPAMIDOL (ISOVUE-300) INJECTION 61% COMPARISON:  Ultrasound 02/10/2018 FINDINGS: Lower chest: Respiratory motion somewhat limits evaluation of the lungs. Minimal atelectasis/scarring at the dependent aspect of the lung bases. Hepatobiliary: Cranial caudal span of the right liver measures 19.3 cm. Subtle irregularity on the surface of the liver. No focal liver lesion. No periportal edema. Gallbladder relatively decompressed, otherwise unremarkable. Pancreas: Unremarkable pancreas Spleen: Unremarkable spleen Adrenals/Urinary Tract: Unremarkable adrenal glands. Unremarkable right kidney with no hydronephrosis or nephrolithiasis. Unremarkable course of the right ureter. Unremarkable left kidney with no hydronephrosis or nephrolithiasis. Unremarkable course of the left ureter. Unremarkable urinary bladder Stomach/Bowel: Unremarkable stomach. Unremarkable small bowel without distention. No focal wall thickening. No inflammatory changes of the mesentery. Normal appendix. No abnormally distended colon. No inflammatory changes. No focal colonic wall thickening Vascular/Lymphatic: Mild atherosclerotic changes of the abdominal aorta. No dissection or aneurysm. No periaortic fluid. The iliac and bilateral proximal femoral arteries are patent. Small lymph nodes in the portacaval nodal station, not significantly enlarged. Small lymph nodes in the gastrohepatic ligament, not enlarged. No significant  mesenteric adenopathy. No retroperitoneal adenopathy. No inguinal adenopathy. Reproductive: Internal prostate calcifications with transverse diameter of the prostate measuring 4.2 cm Other: No abdominal wall hernia Musculoskeletal: No acute displaced fracture. Mild degenerative changes of the spine. Mild degenerative changes of the hips. IMPRESSION: No acute CT finding. There is subtle irregularity of the liver contour without other CT evidence of cirrhosis. Gallbladder is decompressed without cholelithiasis. No evidence of biliary dilatation. Aortic Atherosclerosis (ICD10-I70.0). Electronically Signed   By: Gilmer MorJaime  Wagner D.O.   On: 02/10/2018 14:53   Nm Hepato W/eject Fract  Result Date: 02/10/2018 CLINICAL DATA:  52 year old male with hyperbilirubinemia. Right upper quadrant abdominal pain. Gallbladder wall thickening and mild pericholecystic fluid on ultrasound today. Similar appearance on CT Abdomen and Pelvis today, although on both studies the gallbladder was relatively contracted. No gallstones identified by ultrasound. EXAM: NUCLEAR MEDICINE HEPATOBILIARY IMAGING TECHNIQUE: Sequential images of the abdomen were obtained out to 60 minutes following intravenous administration of radiopharmaceutical. RADIOPHARMACEUTICALS:  7.8 mCi Tc-292m  Choletec IV COMPARISON:  CT Abdomen and Pelvis 1434 hours today. Abdomen ultrasound 1219 hours. FINDINGS: Prompt radiotracer uptake by the liver and clearance of the blood pool. Prompt small bowel activity by  20 minutes. However, no gallbladder activity occurred over the 1st hour of the study. Vicarious contrast excretion in the urinary bladder incidentally noted. Decision was made to augment the exam with 3.5 milligrams IV morphine, and continue imaging. Post morphine imaging for an additional 30 minutes fail to demonstrate any gallbladder activity. Progressive small bowel activity occurred. IMPRESSION: 1. Nonvisualization of the gallbladder despite augmentation of the  study with IV morphine and imaging for 90 total minutes. However, the contracted appearance of the gallbladder on CT and Ultrasound today would make Acute Acalculous Cholecystitis seem unlikely. Favor Chronic Cholecystitis instead. 2. Patent common bile duct. Electronically Signed   By: Odessa Fleming M.D.   On: 02/10/2018 20:11   US Abdomen Limited Ruq  Result Date: 02/10/2018 CLINICAL DATA:  Upper abdominal pain.  Elevated liver enzymes. EXAM: ULTRASOUND ABDOMEN LIMITED RIGHT UPPER QUADRANT COMPARISON:  None. FINDINGS: Gallbladder: Gallbladder wall thickening to 4 mm with small crescent of edema around the fundus. Given negative Murphy sign, no stones, and limited distension this is likely reactive thickening rather than from cholecystitis. Common bile duct: Diameter: 5 mm Liver: Echogenic diffusely with gallbladder fossa sparing. No focal lesion is seen. Portal vein is patent on color Doppler imaging with normal direction of blood flow towards the liver. IMPRESSION: 1. Gallbladder wall thickening and mild pericholecystic edema that is likely reactive, as above. 2. Hepatic steatosis. Electronically Signed   By: Marnee Spring M.D.   On: 02/10/2018 12:40    Time Spent in minutes  30   Susa Raring M.D on 02/12/2018 at 10:04 AM  Between 7am to 7pm - Pager - [phone] ( page via [email or web address], text pages only, please mention full 10 digit call back number). After 7pm go to [email or web address] - password [email or web address]

## 2018-02-12 NOTE — Progress Notes (Signed)
PHARMACY NOTE -  Antibiotics  Pharmacy has been assisting with dosing of Doxycycline & Levaquin for possible cholangitis and infected left breast cyst s/p I&D. Dosage remains stable at Levaquin 750mg  daily & Doxycycline 100mg  BID.  Need for further dosage adjustment appears unlikely at present.    Will sign off at this time.  Please reconsult if a change in clinical status warrants re-evaluation of dosage.  Junita PushMichelle Kalia Vahey, PharmD, BCPS 02/12/2018@10 :14 AM

## 2018-02-13 DIAGNOSIS — N611 Abscess of the breast and nipple: Secondary | ICD-10-CM

## 2018-02-13 LAB — CBC
HCT: 33.9 % — ABNORMAL LOW (ref 39.0–52.0)
Hemoglobin: 11.3 g/dL — ABNORMAL LOW (ref 13.0–17.0)
MCH: 32.1 pg (ref 26.0–34.0)
MCHC: 33.3 g/dL (ref 30.0–36.0)
MCV: 96.3 fL (ref 78.0–100.0)
PLATELETS: 351 10*3/uL (ref 150–400)
RBC: 3.52 MIL/uL — AB (ref 4.22–5.81)
RDW: 12.6 % (ref 11.5–15.5)
WBC: 6.6 10*3/uL (ref 4.0–10.5)

## 2018-02-13 LAB — AEROBIC CULTURE  (SUPERFICIAL SPECIMEN): CULTURE: NORMAL

## 2018-02-13 LAB — COMPREHENSIVE METABOLIC PANEL
ALBUMIN: 2.4 g/dL — AB (ref 3.5–5.0)
ALT: 250 U/L — AB (ref 17–63)
AST: 204 U/L — AB (ref 15–41)
Alkaline Phosphatase: 215 U/L — ABNORMAL HIGH (ref 38–126)
Anion gap: 6 (ref 5–15)
BUN: 12 mg/dL (ref 6–20)
CALCIUM: 7.8 mg/dL — AB (ref 8.9–10.3)
CO2: 24 mmol/L (ref 22–32)
CREATININE: 0.74 mg/dL (ref 0.61–1.24)
Chloride: 107 mmol/L (ref 101–111)
GFR calc non Af Amer: >60 mL/min (ref >60)
GLUCOSE: 105 mg/dL — AB (ref 65–99)
Potassium: 3.9 mmol/L (ref 3.5–5.1)
SODIUM: 137 mmol/L (ref 135–145)
Total Bilirubin: 2.4 mg/dL — ABNORMAL HIGH (ref 0.3–1.2)
Total Protein: 5.5 g/dL — ABNORMAL LOW (ref 6.5–8.1)

## 2018-02-13 LAB — MAGNESIUM: MAGNESIUM: 1.9 mg/dL (ref 1.7–2.4)

## 2018-02-13 LAB — AEROBIC CULTURE W GRAM STAIN (SUPERFICIAL SPECIMEN)

## 2018-02-13 MED ORDER — LEVOFLOXACIN 750 MG PO TABS: 750.0000 mg | ORAL_TABLET | Freq: Every day | ORAL | 0 refills | Status: AC

## 2018-02-13 MED ORDER — DOXYCYCLINE HYCLATE 100 MG PO TABS: 100.0000 mg | ORAL_TABLET | Freq: Two times a day (BID) | ORAL | 0 refills | Status: AC

## 2018-02-13 MED ORDER — ACETAMINOPHEN 325 MG PO TABS: 650.0000 mg | ORAL_TABLET | Freq: Four times a day (QID) | ORAL | 0 refills | Status: AC | PRN

## 2018-02-13 NOTE — Discharge Instructions (Signed)
Follow with Primary MD Pearson ForsterKelly, William S, MD , Dr [name] in 4-5 days, also follow up in the breast clinic within 1 week for your left breast abscess.  Get CBC, CMP, by Primary MD  in 5  days    Activity: As tolerated with Full fall precautions use walker/cane & assistance as needed  Disposition Home    Diet:  Heart Healthy    For Heart failure patients - Check your Weight same time everyday, if you gain over 2 pounds, or you develop in leg swelling, experience more shortness of breath or chest pain, call your Primary MD immediately. Follow Cardiac Low Salt Diet and 1.5 lit/day fluid restriction.  Special Instructions: If you have smoked or chewed Tobacco  in the last 2 yrs please stop smoking, stop any regular Alcohol  and or any Recreational drug use.  On your next visit with your primary care physician please Get Medicines reviewed and adjusted.  Please request your Prim.MD to go over all Hospital Tests and Procedure/Radiological results at the follow up, please get all Hospital records sent to your Prim MD by signing hospital release before you go home.  If you experience worsening of your admission symptoms, develop shortness of breath, life threatening emergency, suicidal or homicidal thoughts you must seek medical attention immediately by calling 911 or calling your MD immediately  if symptoms less severe.  You Must read complete instructions/literature along with all the possible adverse reactions/side effects for all the Medicines you take and that have been prescribed to you. Take any new Medicines after you have completely understood and accpet all the possible adverse reactions/side effects.

## 2018-02-13 NOTE — Progress Notes (Signed)
Discharge instructions reviewed with patient. All questions answered. Patient ambulated to vehicle with belongings by nurse tech 

## 2018-02-13 NOTE — Discharge Summary (Signed)
Tell Manuel Bates:[id number] DOB: 11-04-1966 DOA: 02/10/2018  PCP: Manuel Forster, MD  Admit date: 02/10/2018  Discharge date: 02/13/2018  Admitted From: Home   Disposition:  Home   Recommendations for Outpatient Follow-up:   Follow up with PCP in 1-2 weeks  PCP Please obtain BMP/CBC, 2 view CXR in 1week,  (see Discharge instructions)   PCP Please follow up on the following pending results: None   Home Health: None   Equipment/Devices: None  Consultations: CCS, GI, ID Discharge Condition: Stable   CODE STATUS: Full   Diet Recommendation:  Heart Healthy    Chief Complaint  Patient presents with  . sent for CT scan  . elevated liver enzymes     Brief history of present illness from the day of admission and additional interim summary    Manuel Brier [name] a 52 y.o.malewith medical history significant ofhypertension, left chest wall abscess/ infected cyst on oral antibiotics at home, comes in for 2 to 3 weeks of persistent fevers, chills, abdominal pain, associated with nausea, vomiting . Pain in the RUQ , workup revealed elevated liver enzymes along with bilirubin with HIDA scan being equivocal.                                                                    Hospital Course    1.  Possible chronic cholecystitis.  General surgery and GI on board, CT scan unrevealing but on ultrasound there was edema around the gallbladder although it did not show any gallstones or CBD stones, HIDA scan was equivocal more consistent with chronic cholecystitis.  He has been treated conservatively with bowel rest, empiric Levaquin, his abdominal exam was completely unremarkable, seen by GI general surgery and ID.  It certainly seems that his elevated LFTs could have been due to oral antibiotic exposure which  he was taking prior to the hospitalization for his left chest wall/breast infection and abscess.    Most likely his fever could have been due to the abscess.  His acute hepatitis and HIV serology are negative.  He has tolerated a soft diet without any problems, clinically better, no clinical signs of cholecystitis, liver numbers are still elevated with a stable INR, total bilirubin is trending down, seen by GI cleared for home discharge with outpatient GI and PCP follow-up.  This likely is drug induced liver injury and might take several weeks before numbers start to trend down.  He will be discharged today with outpatient follow-up with GI and PCP.  2.  History of alcohol binging during weekends.  Counseled to quit.  Does not drink on a daily basis will monitor for any signs of withdrawals.  3.  Recent left breast/chest wall abscess/cyst infection.  No surrounding cellulitis, per ID 5 more days of oral Levaquin  and doxycycline combination, site appears clean, he has a pending appointment with the breast clinic which she will keep.  PCP to also monitor the site.   Discharge diagnosis     Active Problems:   Acute cholecystitis   Elevated liver enzymes   Upper abdominal pain   Drug-induced liver injury   Infected cyst of skin   Left breast abscess    Discharge instructions    Discharge Instructions    Diet - low sodium heart healthy   Complete by:  As directed    Discharge instructions   Complete by:  As directed    Follow with Primary MD Manuel ForsterKelly, Manuel S, MD , Dr [name] in 4-5 days, also follow up in the breast clinic within 1 week for your left breast abscess.  Get CBC, CMP, by Primary MD  in 5  days    Activity: As tolerated with Full fall precautions use walker/cane & assistance as needed  Disposition Home    Diet:  Heart Healthy    For Heart failure patients - Check your Weight same time everyday, if you gain over 2 pounds, or you develop in leg swelling, experience more  shortness of breath or chest pain, call your Primary MD immediately. Follow Cardiac Low Salt Diet and 1.5 lit/day fluid restriction.  Special Instructions: If you have smoked or chewed Tobacco  in the last 2 yrs please stop smoking, stop any regular Alcohol  and or any Recreational drug use.  On your next visit with your primary care physician please Get Medicines reviewed and adjusted.  Please request your Prim.MD to go over all Hospital Tests and Procedure/Radiological results at the follow up, please get all Hospital records sent to your Prim MD by signing hospital release before you go home.  If you experience worsening of your admission symptoms, develop shortness of breath, life threatening emergency, suicidal or homicidal thoughts you must seek medical attention immediately by calling 911 or calling your MD immediately  if symptoms less severe.  You Must read complete instructions/literature along with all the possible adverse reactions/side effects for all the Medicines you take and that have been prescribed to you. Take any new Medicines after you have completely understood and accpet all the possible adverse reactions/side effects.   Increase activity slowly   Complete by:  As directed       Discharge Medications   Allergies as of 02/13/2018      Reactions   Statins Anxiety      Medication List    STOP taking these medications   azithromycin 250 MG tablet Commonly known as:  ZITHROMAX   sulfamethoxazole-trimethoprim 800-160 MG tablet Commonly known as:  BACTRIM DS,SEPTRA DS     TAKE these medications   acetaminophen 325 MG tablet Commonly known as:  TYLENOL Take 2 tablets (650 mg total) by mouth every 6 (six) hours as needed for mild pain (or Fever >/= 101).   aspirin 81 MG chewable tablet Chew 81 mg by mouth daily.   doxycycline 100 MG tablet Commonly known as:  VIBRA-TABS Take 1 tablet (100 mg total) by mouth every 12 (twelve) hours.   levofloxacin 750 MG  tablet Commonly known as:  LEVAQUIN Take 1 tablet (750 mg total) by mouth daily.       Follow-up Information    Manuel ForsterKelly, Manuel S, MD Follow up.   Specialty:  Family Medicine Contact information: [address] [place] [id number] [phone]        [name],  Manuel Hart, MD. Schedule an appointment as soon as possible for a visit in 1 week([place]).   Specialty:  Gastroenterology Contact information: [address] [place] [address] [phone]           Major procedures and Radiology Reports - PLEASE review detailed and final reports thoroughly  -        Dg Chest 2 View  Result Date: 02/10/2018 CLINICAL DATA:  Fever and malaise EXAM: CHEST - 2 VIEW COMPARISON:  None. FINDINGS: The heart size and mediastinal contours are within normal limits. Both lungs are clear. Mild scarring in the lingula. The visualized skeletal structures are unremarkable. IMPRESSION: No active cardiopulmonary disease. Electronically Signed   By: Marlan Palau M.D.   On: 02/10/2018 12:48   Ct Abdomen Pelvis W Contrast  Result Date: 02/10/2018 CLINICAL DATA:  52 year old male with a history of elevated LFTs and scleral icterus EXAM: CT ABDOMEN AND PELVIS WITH CONTRAST TECHNIQUE: Multidetector CT imaging of the abdomen and pelvis was performed using the standard protocol following bolus administration of intravenous contrast. CONTRAST:  ISOVUE-300 IOPAMIDOL (ISOVUE-300) INJECTION 61% COMPARISON:  Ultrasound 02/10/2018 FINDINGS: Lower chest: Respiratory motion somewhat limits evaluation of the lungs. Minimal atelectasis/scarring at the dependent aspect of the lung bases. Hepatobiliary: Cranial caudal span of the right liver measures 19.3 cm. Subtle irregularity on the surface of the liver. No focal liver lesion. No periportal edema. Gallbladder relatively decompressed, otherwise unremarkable. Pancreas: Unremarkable pancreas Spleen: Unremarkable spleen Adrenals/Urinary Tract: Unremarkable  adrenal glands. Unremarkable right kidney with no hydronephrosis or nephrolithiasis. Unremarkable course of the right ureter. Unremarkable left kidney with no hydronephrosis or nephrolithiasis. Unremarkable course of the left ureter. Unremarkable urinary bladder Stomach/Bowel: Unremarkable stomach. Unremarkable small bowel without distention. No focal wall thickening. No inflammatory changes of the mesentery. Normal appendix. No abnormally distended colon. No inflammatory changes. No focal colonic wall thickening Vascular/Lymphatic: Mild atherosclerotic changes of the abdominal aorta. No dissection or aneurysm. No periaortic fluid. The iliac and bilateral proximal femoral arteries are patent. Small lymph nodes in the portacaval nodal station, not significantly enlarged. Small lymph nodes in the gastrohepatic ligament, not enlarged. No significant mesenteric adenopathy. No retroperitoneal adenopathy. No inguinal adenopathy. Reproductive: Internal prostate calcifications with transverse diameter of the prostate measuring 4.2 cm Other: No abdominal wall hernia Musculoskeletal: No acute displaced fracture. Mild degenerative changes of the spine. Mild degenerative changes of the hips. IMPRESSION: No acute CT finding. There is subtle irregularity of the liver contour without other CT evidence of cirrhosis. Gallbladder is decompressed without cholelithiasis. No evidence of biliary dilatation. Aortic Atherosclerosis (ICD10-I70.0). Electronically Signed   By: Gilmer Mor D.O.   On: 02/10/2018 14:53   Nm Hepato W/eject Fract  Result Date: 02/10/2018 CLINICAL DATA:  52 year old male with hyperbilirubinemia. Right upper quadrant abdominal pain. Gallbladder wall thickening and mild pericholecystic fluid on ultrasound today. Similar appearance on CT Abdomen and Pelvis today, although on both studies the gallbladder was relatively contracted. No gallstones identified by ultrasound. EXAM: NUCLEAR MEDICINE HEPATOBILIARY  IMAGING TECHNIQUE: Sequential images of the abdomen were obtained out to 60 minutes following intravenous administration of radiopharmaceutical. RADIOPHARMACEUTICALS:  7.8 mCi Tc-56m  Choletec IV COMPARISON:  CT Abdomen and Pelvis 1434 hours today. Abdomen ultrasound 1219 hours. FINDINGS: Prompt radiotracer uptake by the liver and clearance of the blood pool. Prompt small bowel activity by 20 minutes. However, no gallbladder activity occurred over the 1st hour of the study. Vicarious contrast excretion in the urinary bladder incidentally noted. Decision was made to augment  the exam with 3.5 milligrams IV morphine, and continue imaging. Post morphine imaging for an additional 30 minutes fail to demonstrate any gallbladder activity. Progressive small bowel activity occurred. IMPRESSION: 1. Nonvisualization of the gallbladder despite augmentation of the study with IV morphine and imaging for 90 total minutes. However, the contracted appearance of the gallbladder on CT and Ultrasound today would make Acute Acalculous Cholecystitis seem unlikely. Favor Chronic Cholecystitis instead. 2. Patent common bile duct. Electronically Signed   By: Odessa Fleming M.D.   On: 02/10/2018 20:11   US Abdomen Limited Ruq  Result Date: 02/10/2018 CLINICAL DATA:  Upper abdominal pain.  Elevated liver enzymes. EXAM: ULTRASOUND ABDOMEN LIMITED RIGHT UPPER QUADRANT COMPARISON:  None. FINDINGS: Gallbladder: Gallbladder wall thickening to 4 mm with small crescent of edema around the fundus. Given negative Murphy sign, no stones, and limited distension this is likely reactive thickening rather than from cholecystitis. Common bile duct: Diameter: 5 mm Liver: Echogenic diffusely with gallbladder fossa sparing. No focal lesion is seen. Portal vein is patent on color Doppler imaging with normal direction of blood flow towards the liver. IMPRESSION: 1. Gallbladder wall thickening and mild pericholecystic edema that is likely reactive, as above. 2.  Hepatic steatosis. Electronically Signed   By: Marnee Spring M.D.   On: 02/10/2018 12:40    Micro Results     Recent Results (from the past 240 hour([place]))  Urine culture     Status: None   Collection Time: 02/10/18  1:08 PM  Result Value Ref Range Status   Specimen Description   Final    URINE, CLEAN CATCH Performed at [facility], [address] [place], [place] [address]    Special Requests   Final    NONE Performed at [facility], [address], [place], [place] [address]    Culture   Final    NO GROWTH Performed at [facility], [address] [place], [place] [address]    Report Status 02/12/2018 FINAL  Final  Wound or Superficial Culture     Status: None (Preliminary result)   Collection Time: 02/10/18  4:01 PM  Result Value Ref Range Status   Specimen Description   Final    ABSCESS LEFT BREAST Performed at [facility], [address] [place], [place] [address]    Special Requests   Final    NONE Performed at [facility], [address] [place], [place] [address]    Gram Stain   Final    FEW WBC PRESENT,BOTH PMN AND MONONUCLEAR MODERATE GRAM POSITIVE COCCI IN PAIRS    Culture   Final    CULTURE REINCUBATED FOR BETTER GROWTH Performed at [facility], [address] [place], [place] [address]    Report Status PENDING  Incomplete  Blood culture (routine x 2)     Status: None (Preliminary result)   Collection Time: 02/10/18  4:47 PM  Result Value Ref Range Status   Specimen Description   Final    BLOOD LEFT ANTECUBITAL Performed at [facility], [address], [place], [place] [address]    Special Requests   Final    BOTTLES DRAWN AEROBIC AND ANAEROBIC Blood Culture adequate volume Performed at [facility], [address], [place], [place] [address]    Culture   Final    NO GROWTH 2 DAYS Performed at [facility], [address], [place], [place] [address]  Report Status PENDING  Incomplete  Blood culture (routine x 2)     Status: None (Preliminary result)   Collection Time: 02/10/18  4:47 PM  Result Value Ref Range Status   Specimen Description   Final    BLOOD LEFT HAND Performed at [facility], [address] [place], [place] [address]    Special Requests   Final    BOTTLES DRAWN AEROBIC AND ANAEROBIC Blood Culture adequate volume Performed at [facility], [address] [place], [place] [address]    Culture   Final    NO GROWTH 2 DAYS Performed at [facility], [address], [place], [place] [address]    Report Status PENDING  Incomplete    Today   Subjective    Manuel Bates today has no headache,no chest abdominal pain,no new weakness tingling or numbness, feels much better wants to go home today.     Objective   Blood pressure 110/81, pulse 77, temperature 98.4 F (36.9 C), temperature source Oral, resp. rate 16, height 5\' 9"  (1.753 m), weight 83.4 kg (183 lb 13.8 oz), SpO2 100 %.   Intake/Output Summary (Last 24 hours) at 02/13/2018 0845 Last data filed at 02/13/2018 0603 Gross per 24 hour  Intake 2283.75 ml  Output 1450 ml  Net 833.75 ml    Exam Awake Alert, Oriented x 3, No new F.N deficits, Normal affect [place].AT,PERRAL Supple Neck,No JVD, No cervical lymphadenopathy appriciated.  Symmetrical Chest wall movement, Good air movement bilaterally, CTAB RRR,No Gallops,Rubs or new Murmurs, No Parasternal Heave +ve B.Sounds, Abd Soft, Non tender, No organomegaly appriciated, No rebound -guarding or rigidity. No Cyanosis, Clubbing or edema, No new Rash or bruise, L breast abscess stable, no drainage or cellulitis   Data Review   CBC w Diff:  Lab Results  Component Value Date   WBC 6.6 02/13/2018   HGB 11.3 (L) 02/13/2018   HCT 33.9 (L) 02/13/2018   PLT 351 02/13/2018    CMP:  Lab Results  Component Value Date   NA 137  02/13/2018   K 3.9 02/13/2018   CL 107 02/13/2018   CO2 24 02/13/2018   BUN 12 02/13/2018   CREATININE 0.74 02/13/2018   PROT 5.5 (L) 02/13/2018   ALBUMIN 2.4 (L) 02/13/2018   BILITOT 2.4 (H) 02/13/2018   ALKPHOS 215 (H) 02/13/2018   AST 204 (H) 02/13/2018   ALT 250 (H) 02/13/2018  .   Total Time in preparing paper work, data evaluation and todays exam - 35 minutes  Susa Raring M.D on 02/13/2018 at 8:45 AM  [facility]   Office  [phone]

## 2018-02-13 NOTE — Progress Notes (Signed)
Subjective: Feeling well.  No complaints.  Objective: Vital signs in last 24 hours: Temp:  [98.4 F (36.9 C)] 98.4 F (36.9 C) (03/18 0603) Pulse Rate:  [73-79] 77 (03/18 0603) Resp:  [16-18] 16 (03/18 0603) BP: (99-110)/(76-84) 110/81 (03/18 0603) SpO2:  [99 %-100 %] 100 % (03/18 0603) Last BM Date: 02/12/18  Intake/Output from previous day: 03/17 0701 - 03/18 0700 In: 2283.8 [P.O.:540; I.V.:1743.8] Out: 1450 [Urine:1450] Intake/Output this shift: No intake/output data recorded.  General appearance: alert and no distress GI: soft, non-tender; bowel sounds normal; no masses,  no organomegaly  Lab Results: Recent Labs    02/10/18 1204 02/12/18 0431 02/13/18 0444  WBC 11.4* 8.6 6.6  HGB 14.7 12.1* 11.3*  HCT 40.8 36.0* 33.9*  PLT 276 312 351   BMET Recent Labs    02/11/18 0502 02/12/18 0431 02/13/18 0444  NA 135 137 137  K 3.9 3.9 3.9  CL 101 103 107  CO2 24 27 24   GLUCOSE 102* 100* 105*  BUN 22* 16 12  CREATININE 1.09 0.88 0.74  CALCIUM 8.0* 8.0* 7.8*   LFT Recent Labs    02/13/18 0444  PROT 5.5*  ALBUMIN 2.4*  AST 204*  ALT 250*  ALKPHOS 215*  BILITOT 2.4*   PT/INR Recent Labs    02/11/18 0930  LABPROT 15.0  INR 1.18   Hepatitis Panel Recent Labs    02/11/18 1432  HEPBSAG Negative  HCVAB <0.1  HEPAIGM Negative  HEPBIGM Negative   C-Diff No results for input(s): CDIFFTOX in the last 72 hours. Fecal Lactopherrin No results for input(s): FECLLACTOFRN in the last 72 hours.  Studies/Results: No results found.  Medications:  Scheduled: . doxycycline  100 mg Oral Q12H  . enoxaparin (LOVENOX) injection  40 mg Subcutaneous Q24H  . levofloxacin  750 mg Oral Daily  . nicotine  21 mg Transdermal Daily   Continuous: . sodium chloride 75 mL/hr at 02/12/18 1948    Assessment/Plan: 1) DILI  - This is thought to be secondary to Bactrim. 2) Left breast cyst.   The patient is clinically stable, however, his liver enzymes have increased.   There is no further fever.  The TB has declined significantly.  INR on 02/11/2018 was at 1.18 and there are no clinically overt features of hepatic dysfunction.  Complete normalization of the liver panel may take weeks to months.  There is no current evidence of DRESS as it is associated with very high and persistent fevers.  The CBC was not differentiated for eosinophils.  Plan: 1) Follow up in the office in one week. 2) He knows to call me if there is any significant change.  LOS: 3 days   Aquanetta Schwarz D 02/13/2018, 7:52 AM

## 2018-02-15 LAB — CULTURE, BLOOD (ROUTINE X 2)
Culture: NO GROWTH
Culture: NO GROWTH
Special Requests: ADEQUATE
Special Requests: ADEQUATE

## 2019-06-11 IMAGING — CT CT ABD-PELV W/ CM
2 of 5 series · 15 of 46 positions shown, 17 images · IV contrast (ISOVUE)
Comparison: Ultrasound 02/10/2018

CLINICAL DATA: 51-year-old male with a history of elevated LFTs and
scleral icterus

EXAM:
CT ABDOMEN AND PELVIS WITH CONTRAST
TECHNIQUE: Multidetector CT imaging of the abdomen and pelvis was performed
using the standard protocol following bolus administration of
intravenous contrast.
CONTRAST:  100mL PN32IJ-GQQ IOPAMIDOL (PN32IJ-GQQ) INJECTION 61%

[Series 2: axial st · axial · 0.72mm/px · z∈[-471,-56]mm · 12 of 95 slices shown, 14 images]
[im 6/95  soft-tissue]
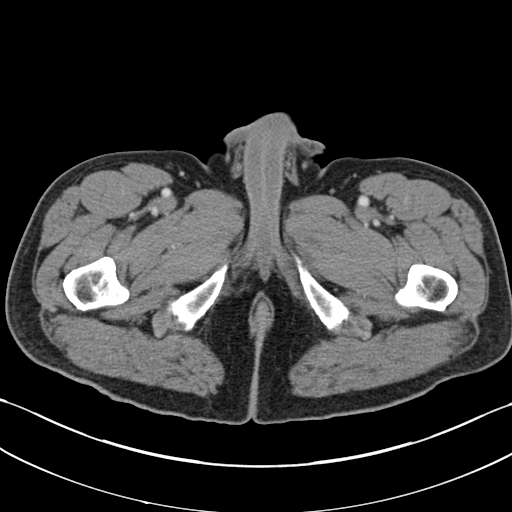
[im 6/95  bone]
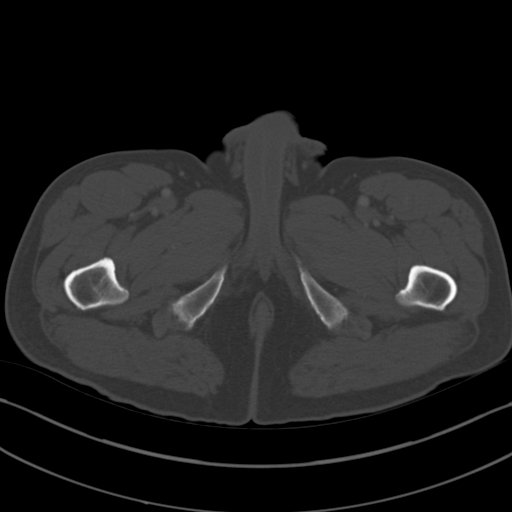
[im 12/95  soft-tissue]
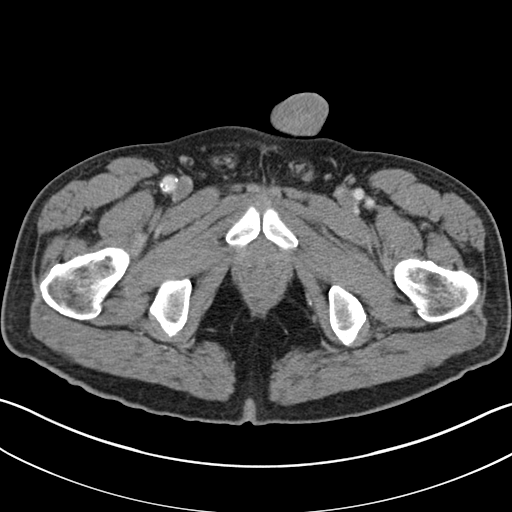
[im 24/95  soft-tissue]
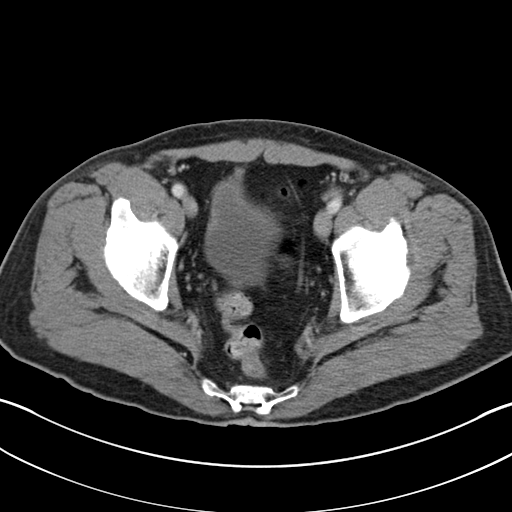
[im 30/95  soft-tissue]
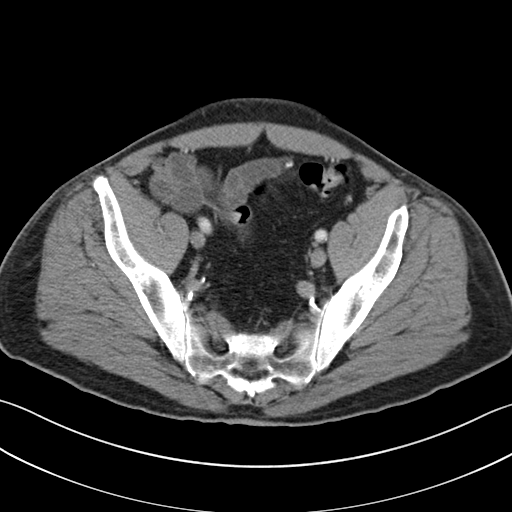
[im 36/95  soft-tissue]
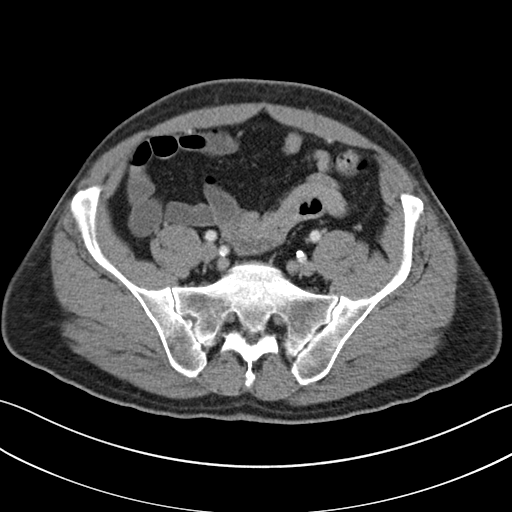
[im 42/95  soft-tissue]
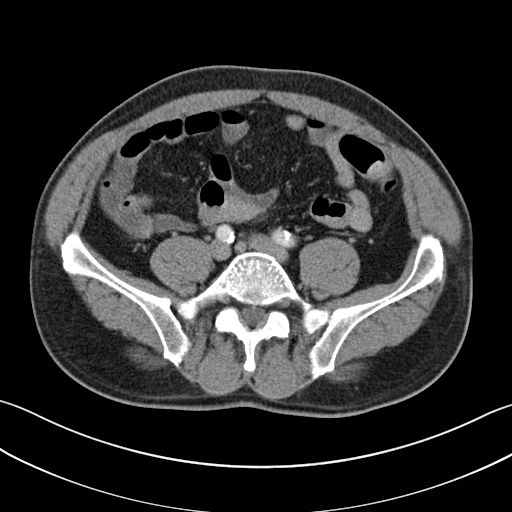
[im 53/95  soft-tissue]
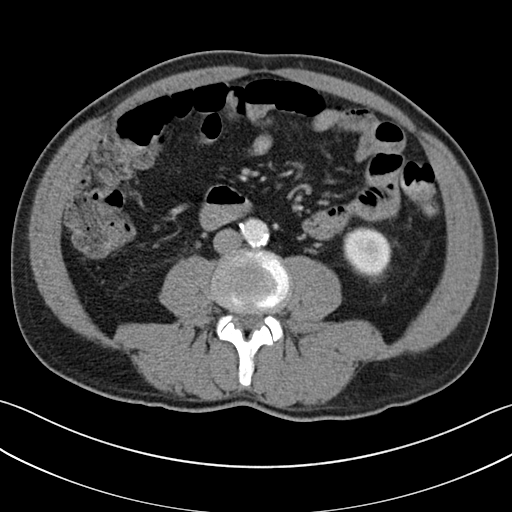
[im 59/95  soft-tissue]
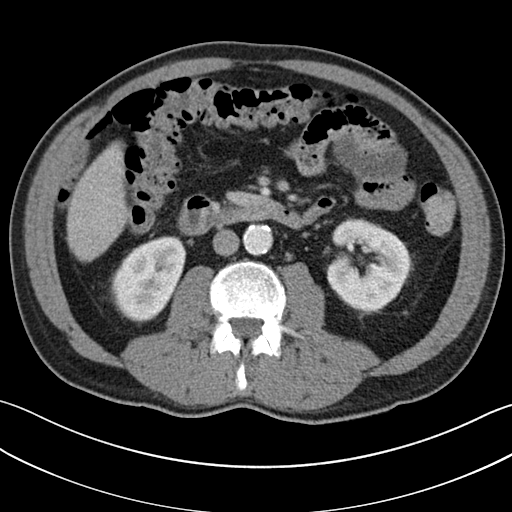
[im 65/95  soft-tissue]
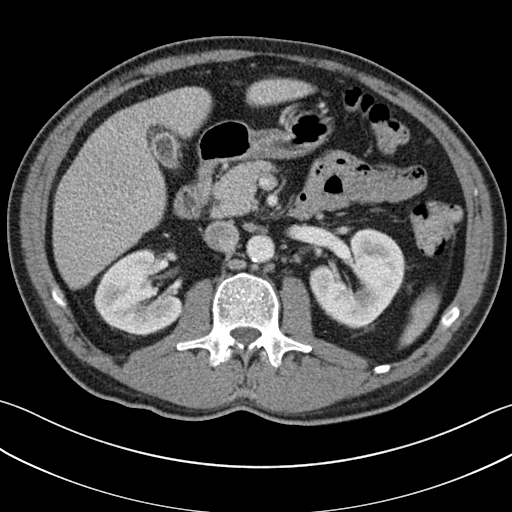
[im 65/95  bone]
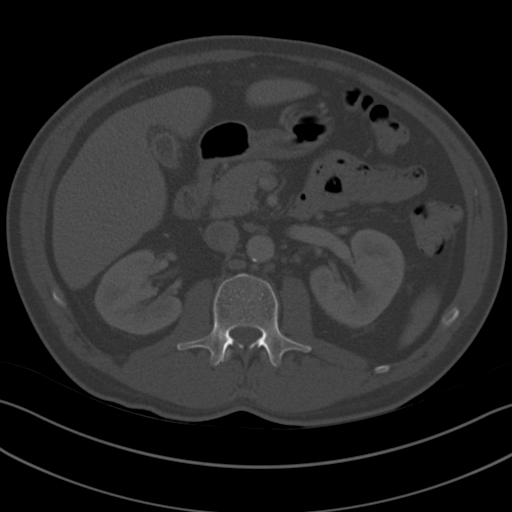
[im 71/95  soft-tissue]
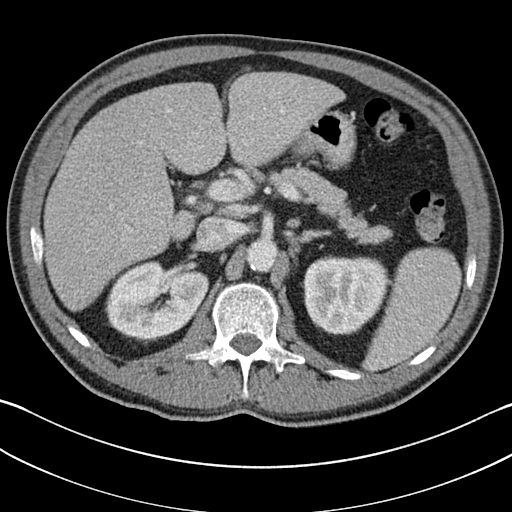
[im 83/95  soft-tissue]
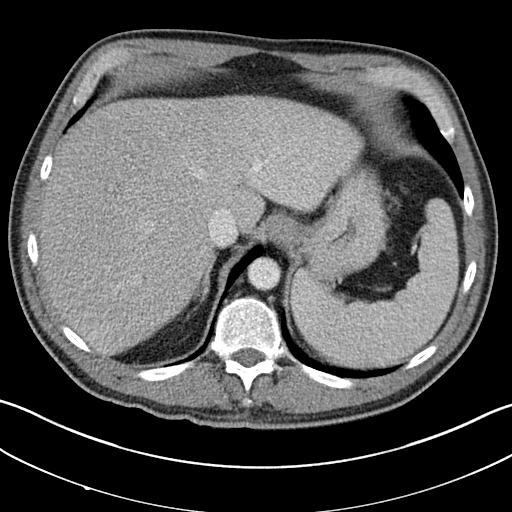
[im 89/95  soft-tissue]
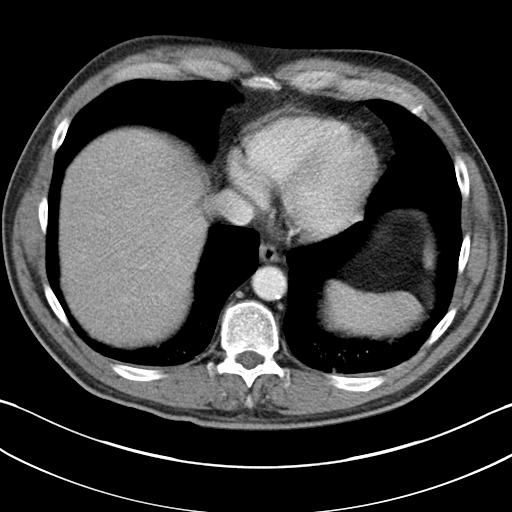

[Series 5: coronal st · coronal · 0.73mm/px · 3 of 94 slices shown]
[im 32/94  soft-tissue]
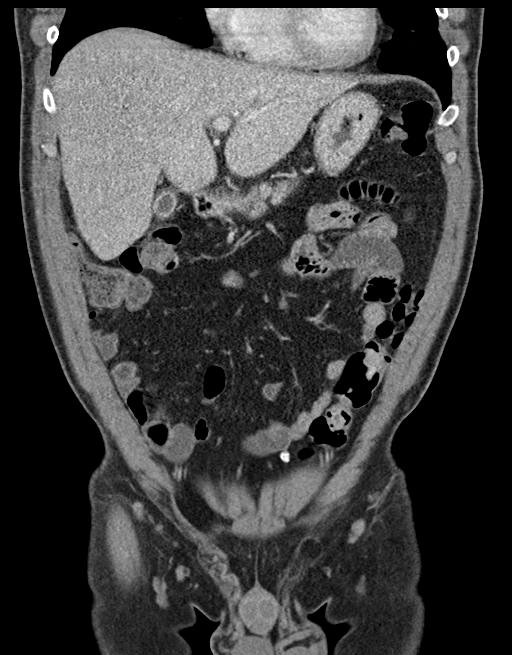
[im 42/94  soft-tissue]
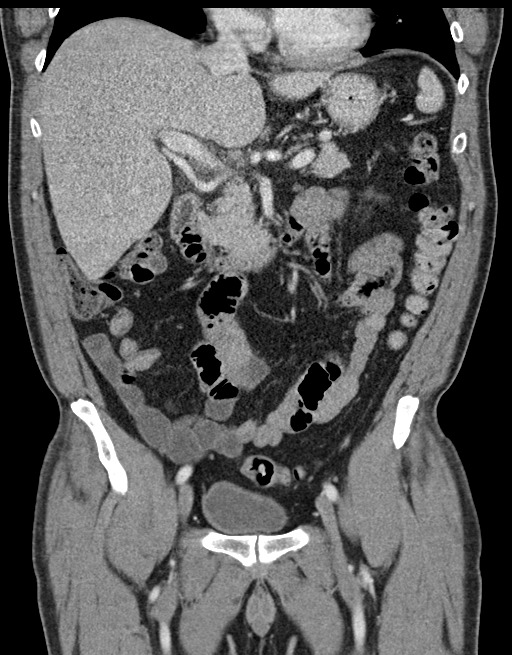
[im 52/94  soft-tissue]
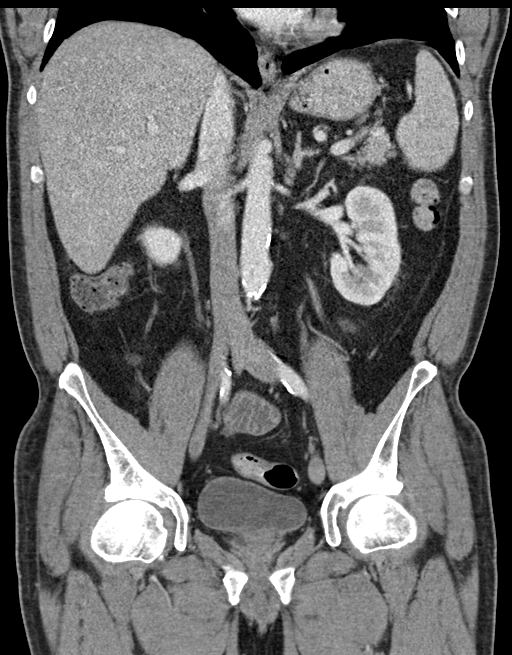

[15 of 46 positions shown; findings below may reference images not displayed]

FINDINGS: Lower chest: Respiratory motion somewhat limits evaluation of the
lungs. Minimal atelectasis/scarring at the dependent aspect of the
lung bases.

Hepatobiliary: Cranial caudal span of the right liver measures
cm. Subtle irregularity on the surface of the liver. No focal liver
lesion. No periportal edema.

Gallbladder relatively decompressed, otherwise unremarkable.

Pancreas: Unremarkable pancreas

Spleen: Unremarkable spleen

Adrenals/Urinary Tract: Unremarkable adrenal glands.

Unremarkable right kidney with no hydronephrosis or nephrolithiasis.
Unremarkable course of the right ureter.

Unremarkable left kidney with no hydronephrosis or nephrolithiasis.
Unremarkable course of the left ureter.

Unremarkable urinary bladder

Stomach/Bowel: Unremarkable stomach. Unremarkable small bowel
without distention. No focal wall thickening. No inflammatory
changes of the mesentery. Normal appendix. No abnormally distended
colon. No inflammatory changes. No focal colonic wall thickening

Vascular/Lymphatic: Mild atherosclerotic changes of the abdominal
aorta. No dissection or aneurysm. No periaortic fluid. The iliac and
bilateral proximal femoral arteries are patent.

Small lymph nodes in the portacaval nodal station, not significantly
enlarged. Small lymph nodes in the gastrohepatic ligament, not
enlarged. No significant mesenteric adenopathy. No retroperitoneal
adenopathy. No inguinal adenopathy.

Reproductive: Internal prostate calcifications with transverse
diameter of the prostate measuring 4.2 cm

Other: No abdominal wall hernia

Musculoskeletal: No acute displaced fracture. Mild degenerative
changes of the spine. Mild degenerative changes of the hips.
IMPRESSION: No acute CT finding.

There is subtle irregularity of the liver contour without other CT
evidence of cirrhosis.

Gallbladder is decompressed without cholelithiasis. No evidence of
biliary dilatation.

Aortic Atherosclerosis ([id number]).

## 2019-06-11 IMAGING — CR DG CHEST 2V
2 series · 2 of 2 positions shown · non-contrast
Comparison: None.

CLINICAL DATA: Fever and malaise

EXAM:
CHEST - 2 VIEW

[w chest pa]
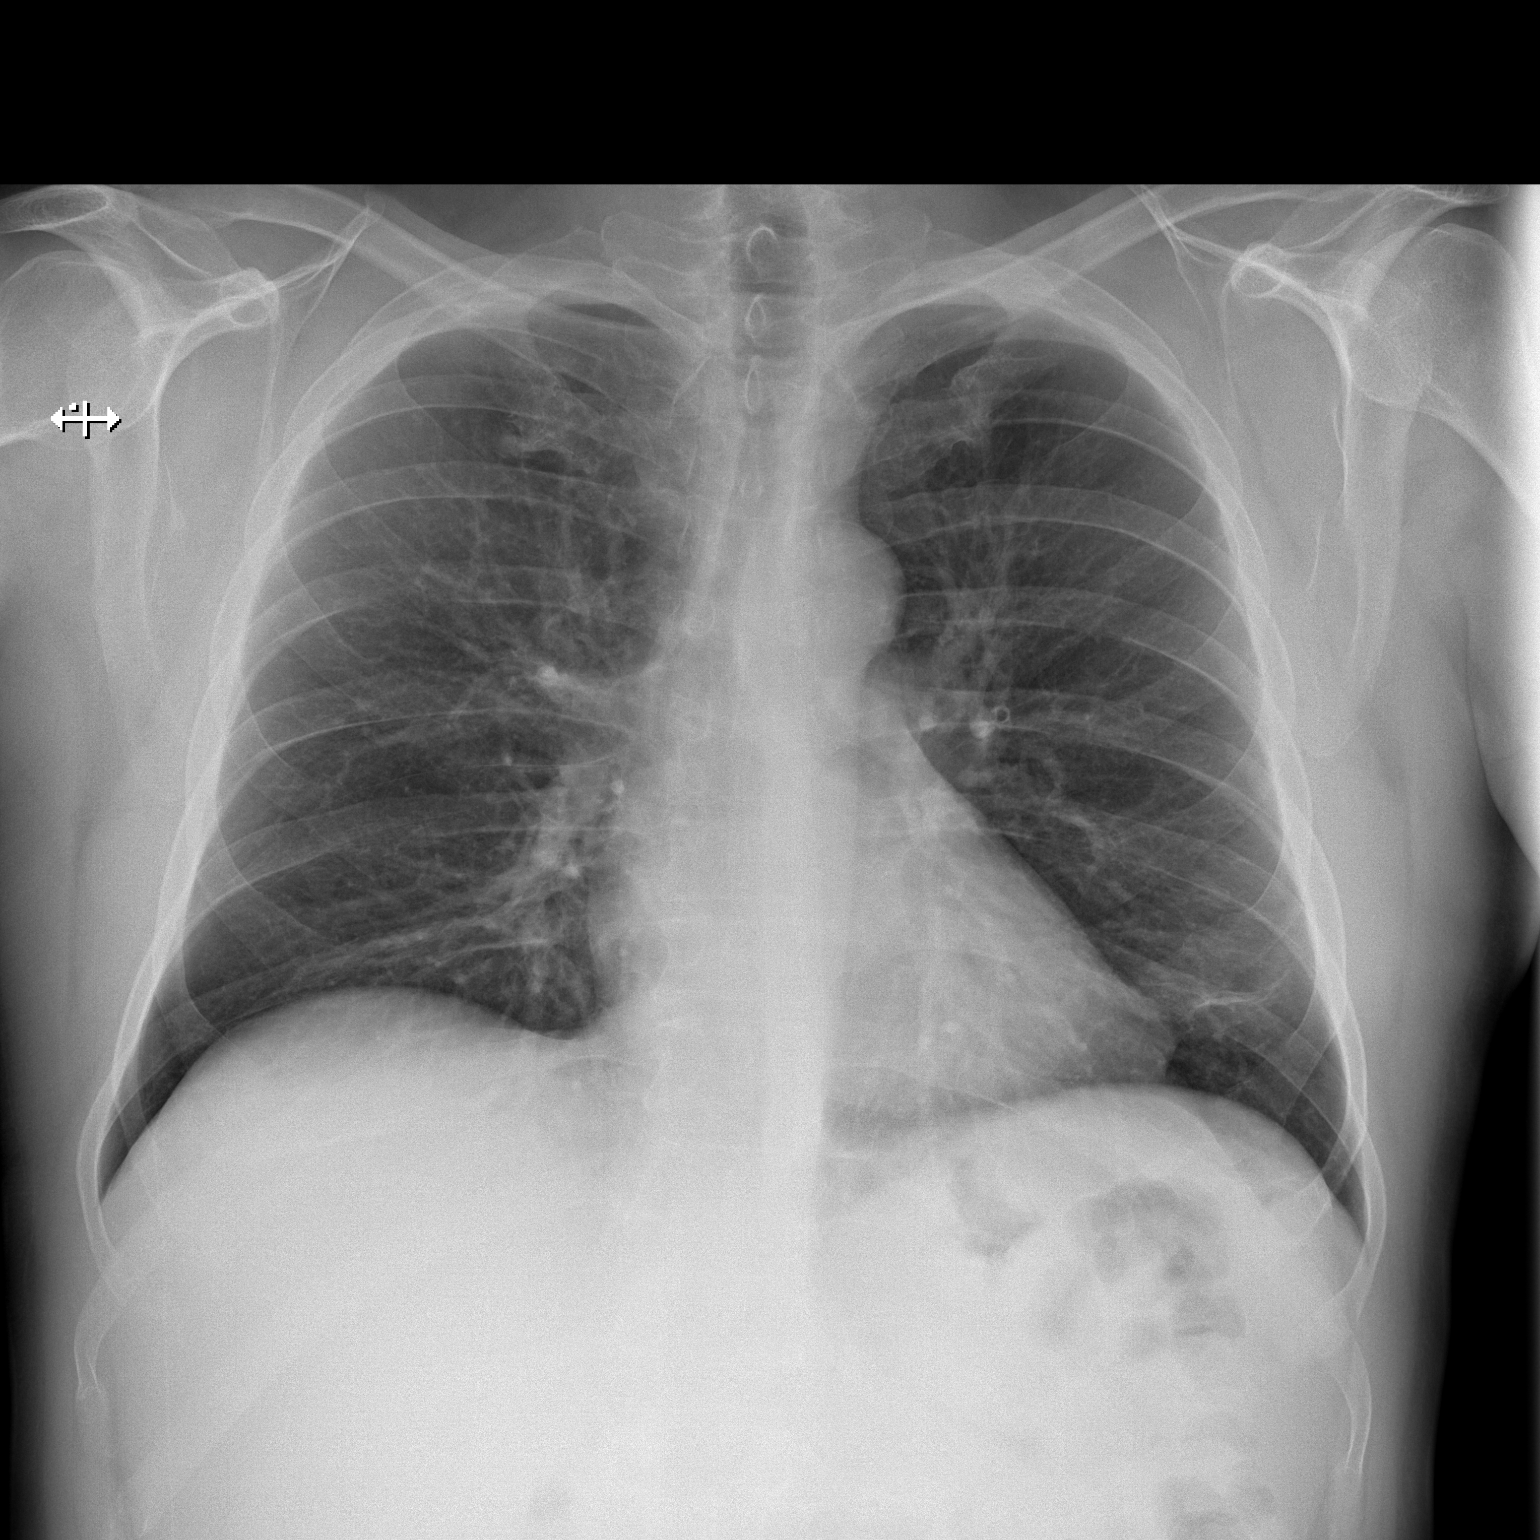

[w chest lat]
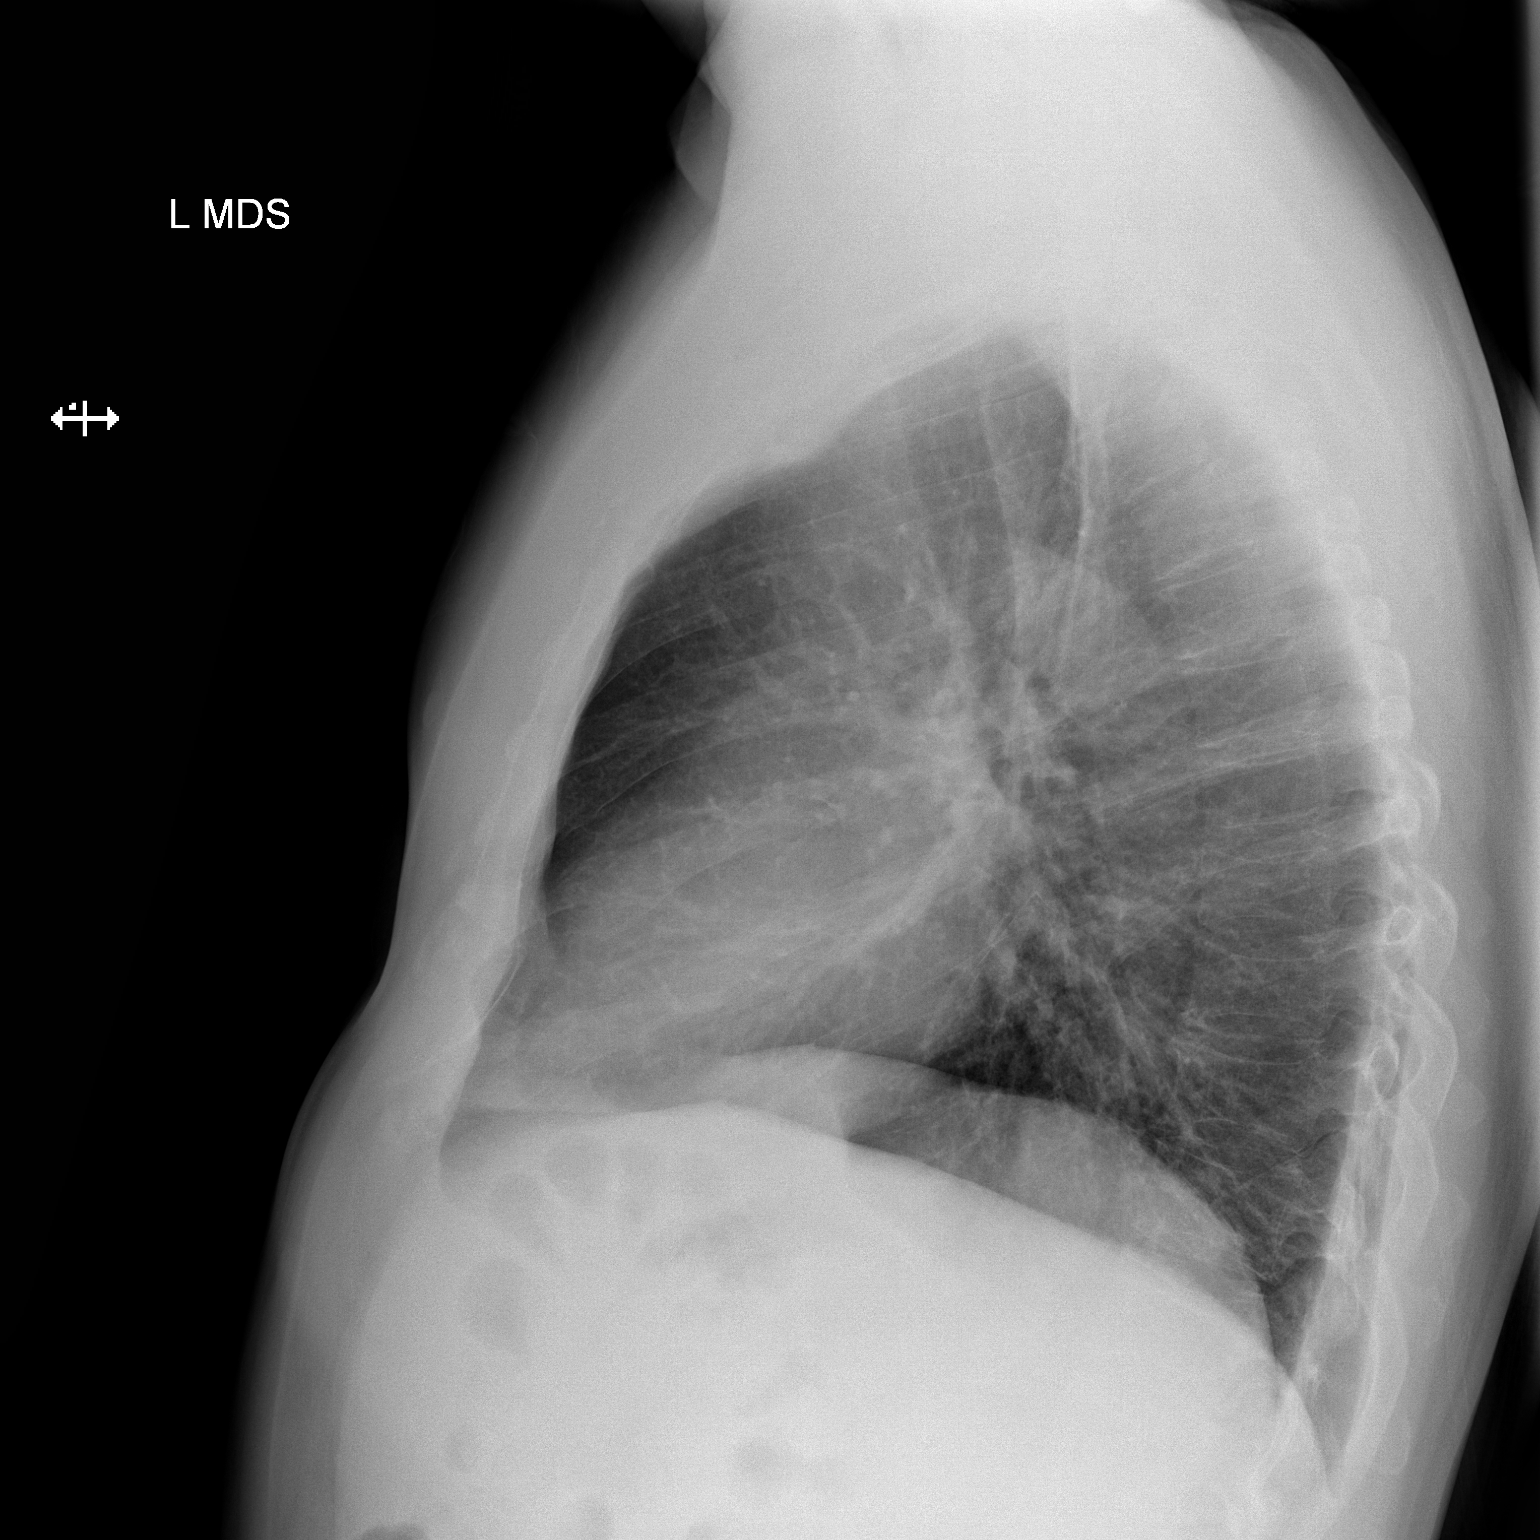

[2 of 2 positions shown; findings below may reference images not displayed]

FINDINGS: The heart size and mediastinal contours are within normal limits.
Both lungs are clear. Mild scarring in the lingula. The visualized
skeletal structures are unremarkable.
IMPRESSION: No active cardiopulmonary disease.
